# Patient Record
Sex: Female | Born: 1950 | Race: White | Hispanic: No | Marital: Married | Smoking: Never smoker
Health system: Southern US, Community
[De-identification: ages and names within clinical notes are randomized; demographics above are authoritative.]

## PROBLEM LIST (undated history)

## (undated) DIAGNOSIS — E559 Vitamin D deficiency, unspecified: Secondary | ICD-10-CM

## (undated) DIAGNOSIS — K635 Polyp of colon: Secondary | ICD-10-CM

## (undated) DIAGNOSIS — R609 Edema, unspecified: Secondary | ICD-10-CM

## (undated) DIAGNOSIS — E785 Hyperlipidemia, unspecified: Secondary | ICD-10-CM

## (undated) DIAGNOSIS — N3946 Mixed incontinence: Secondary | ICD-10-CM

## (undated) DIAGNOSIS — N952 Postmenopausal atrophic vaginitis: Secondary | ICD-10-CM

## (undated) DIAGNOSIS — K279 Peptic ulcer, site unspecified, unspecified as acute or chronic, without hemorrhage or perforation: Secondary | ICD-10-CM

## (undated) DIAGNOSIS — K219 Gastro-esophageal reflux disease without esophagitis: Secondary | ICD-10-CM

## (undated) DIAGNOSIS — I10 Essential (primary) hypertension: Secondary | ICD-10-CM

## (undated) HISTORY — DX: Postmenopausal atrophic vaginitis: N95.2

## (undated) HISTORY — DX: Peptic ulcer, site unspecified, unspecified as acute or chronic, without hemorrhage or perforation: K27.9

## (undated) HISTORY — DX: Polyp of colon: K63.5

## (undated) HISTORY — DX: Hyperlipidemia, unspecified: E78.5

## (undated) HISTORY — DX: Essential (primary) hypertension: I10

## (undated) HISTORY — DX: Gastro-esophageal reflux disease without esophagitis: K21.9

## (undated) HISTORY — DX: Mixed incontinence: N39.46

## (undated) HISTORY — DX: Vitamin D deficiency, unspecified: E55.9

## (undated) HISTORY — DX: Edema, unspecified: R60.9

---

## 1999-08-29 ENCOUNTER — Other Ambulatory Visit: Admission: RE | Admit: 1999-08-29 | Discharge: 1999-08-29 | Payer: Self-pay | Admitting: Obstetrics and Gynecology

## 1999-08-29 ENCOUNTER — Encounter (INDEPENDENT_AMBULATORY_CARE_PROVIDER_SITE_OTHER): Payer: Self-pay

## 1999-09-04 ENCOUNTER — Encounter: Payer: Self-pay | Admitting: Family Medicine

## 1999-09-04 ENCOUNTER — Encounter: Admission: RE | Admit: 1999-09-04 | Discharge: 1999-09-04 | Payer: Self-pay | Admitting: Family Medicine

## 1999-09-12 ENCOUNTER — Encounter: Payer: Self-pay | Admitting: Family Medicine

## 1999-09-12 ENCOUNTER — Encounter: Admission: RE | Admit: 1999-09-12 | Discharge: 1999-09-12 | Payer: Self-pay | Admitting: Family Medicine

## 1999-10-16 ENCOUNTER — Ambulatory Visit (HOSPITAL_COMMUNITY): Admission: RE | Admit: 1999-10-16 | Discharge: 1999-10-16 | Payer: Self-pay | Admitting: Obstetrics and Gynecology

## 1999-10-16 ENCOUNTER — Encounter (INDEPENDENT_AMBULATORY_CARE_PROVIDER_SITE_OTHER): Payer: Self-pay | Admitting: Specialist

## 2001-01-14 ENCOUNTER — Encounter: Payer: Self-pay | Admitting: Family Medicine

## 2001-01-14 ENCOUNTER — Encounter: Admission: RE | Admit: 2001-01-14 | Discharge: 2001-01-14 | Payer: Self-pay | Admitting: Family Medicine

## 2001-01-15 ENCOUNTER — Other Ambulatory Visit: Admission: RE | Admit: 2001-01-15 | Discharge: 2001-01-15 | Payer: Self-pay | Admitting: Obstetrics and Gynecology

## 2002-02-25 ENCOUNTER — Other Ambulatory Visit: Admission: RE | Admit: 2002-02-25 | Discharge: 2002-02-25 | Payer: Self-pay | Admitting: Obstetrics and Gynecology

## 2002-09-15 ENCOUNTER — Encounter (INDEPENDENT_AMBULATORY_CARE_PROVIDER_SITE_OTHER): Payer: Self-pay | Admitting: Specialist

## 2002-09-15 ENCOUNTER — Ambulatory Visit (HOSPITAL_COMMUNITY): Admission: RE | Admit: 2002-09-15 | Discharge: 2002-09-15 | Payer: Self-pay | Admitting: Gastroenterology

## 2003-03-01 ENCOUNTER — Encounter: Admission: RE | Admit: 2003-03-01 | Discharge: 2003-03-01 | Payer: Self-pay | Admitting: Obstetrics and Gynecology

## 2003-03-03 ENCOUNTER — Other Ambulatory Visit: Admission: RE | Admit: 2003-03-03 | Discharge: 2003-03-03 | Payer: Self-pay | Admitting: Obstetrics and Gynecology

## 2006-11-21 ENCOUNTER — Ambulatory Visit (HOSPITAL_COMMUNITY): Admission: RE | Admit: 2006-11-21 | Discharge: 2006-11-21 | Payer: Self-pay | Admitting: Emergency Medicine

## 2006-11-21 ENCOUNTER — Ambulatory Visit: Payer: Self-pay | Admitting: Surgery

## 2006-12-11 ENCOUNTER — Encounter: Admission: RE | Admit: 2006-12-11 | Discharge: 2006-12-11 | Payer: Self-pay | Admitting: Gastroenterology

## 2006-12-19 ENCOUNTER — Encounter: Admission: RE | Admit: 2006-12-19 | Discharge: 2006-12-19 | Payer: Self-pay | Admitting: Gastroenterology

## 2007-02-20 ENCOUNTER — Other Ambulatory Visit: Admission: RE | Admit: 2007-02-20 | Discharge: 2007-02-20 | Payer: Self-pay | Admitting: Family Medicine

## 2007-11-18 ENCOUNTER — Encounter: Admission: RE | Admit: 2007-11-18 | Discharge: 2007-11-18 | Payer: Self-pay | Admitting: Emergency Medicine

## 2008-01-28 IMAGING — CT CT ABDOMEN W/ CM
2 of 5 series · 16 of 46 positions shown, 18 images · IV contrast ([ID] OMNI 300)
Comparison: None.

ABDOMEN CT WITH CONTRAST:

Addendum Begins
There is a typographical error in the abdomen section of this report. In the
fourth sentence before the impression, the sentence reads "Abdominal bowel loops
abnormal imaging features." This sentence should read "Abdominal bowel loops
have normal imaging features."
Addendum Ends
CLINICAL DATA: Abdominal pain. Nausea. Question of extrinsic compression seen on
endoscopy recently.
TECHNIQUE: Multidetector CT imaging of the abdomen and pelvis was performed
following the standard protocol during bolus administration of intravenous
contrast.

Contrast:  125 cc Omnipaque 300

[Series 3: routine abdomen · axial · 0.90mm/px · z∈[-421,+4]mm · 13 of 95 slices shown, 15 images]
[im 5/95  soft-tissue]
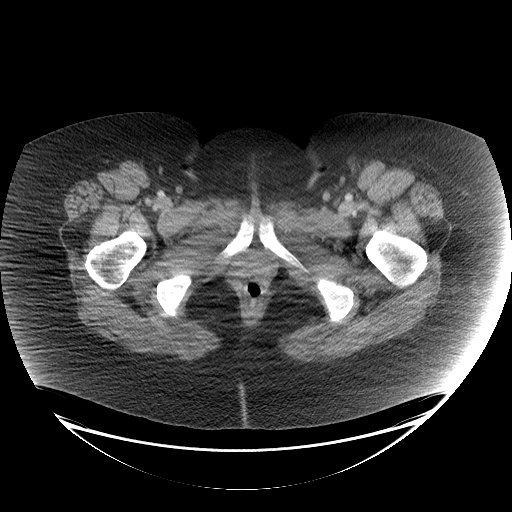
[im 5/95  bone]
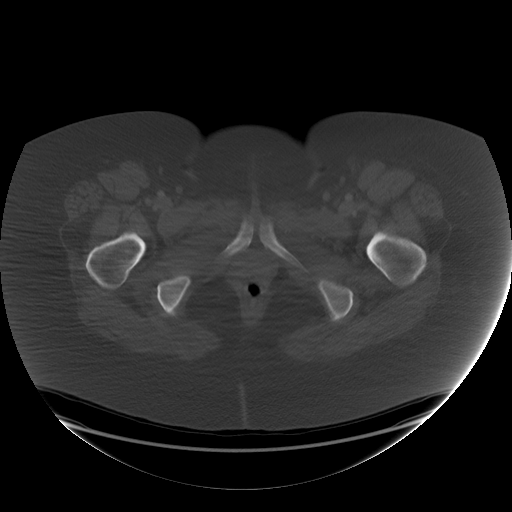
[im 15/95  soft-tissue]
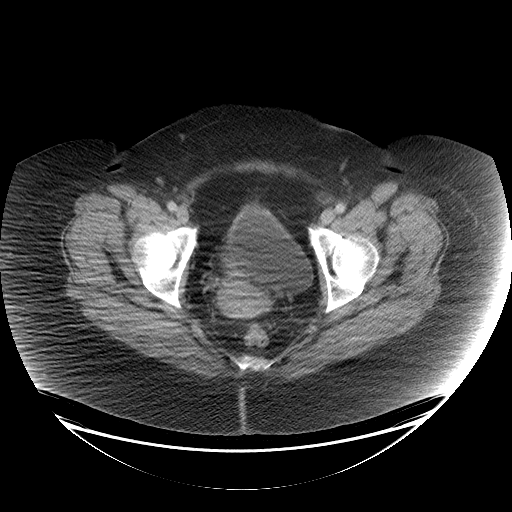
[im 19/95  soft-tissue]
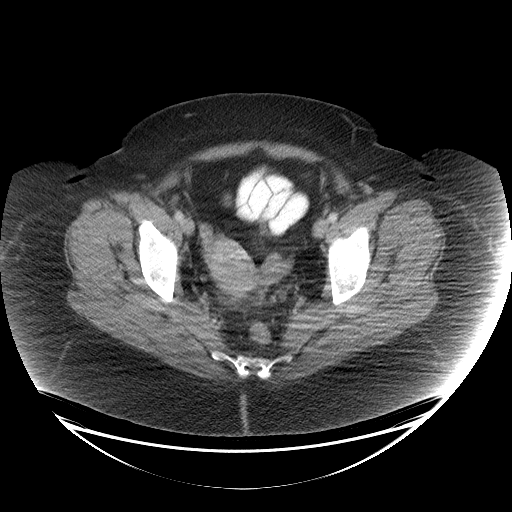
[im 29/95  soft-tissue]
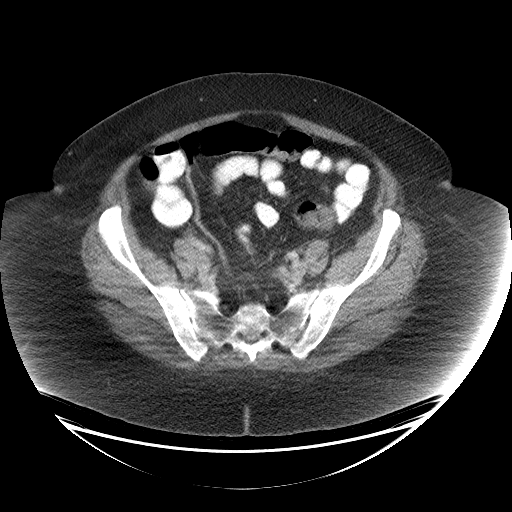
[im 33/95  soft-tissue]
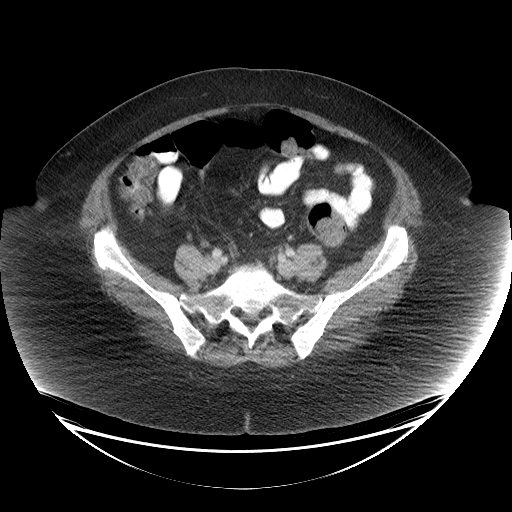
[im 43/95  soft-tissue]
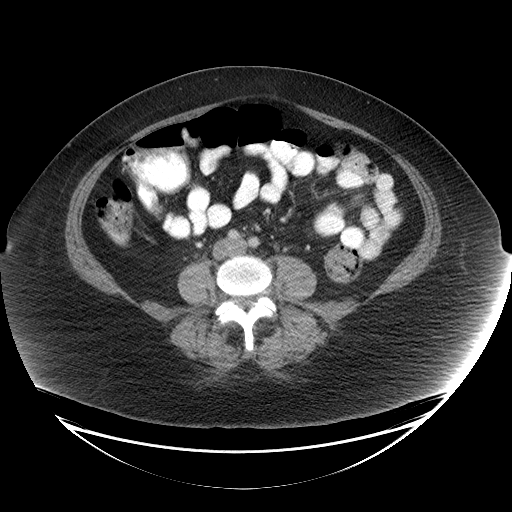
[im 48/95  soft-tissue]
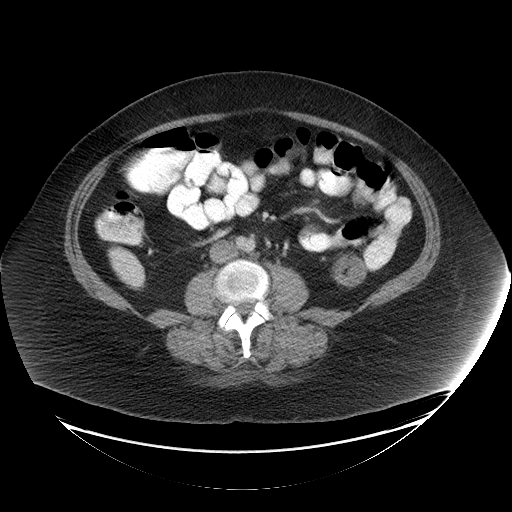
[im 52/95  soft-tissue]
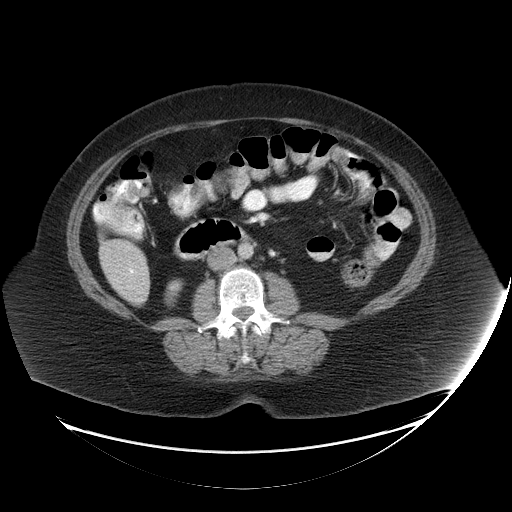
[im 62/95  soft-tissue]
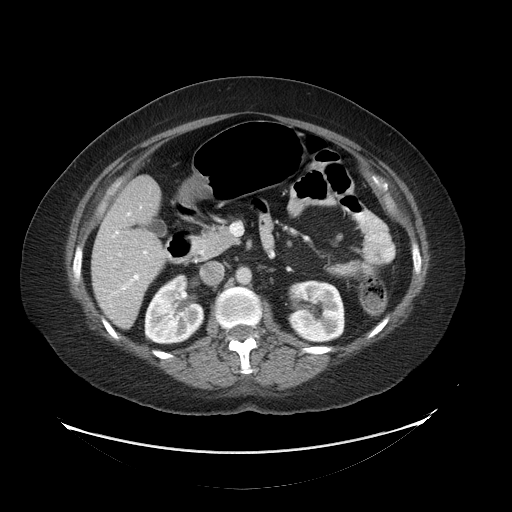
[im 62/95  bone]
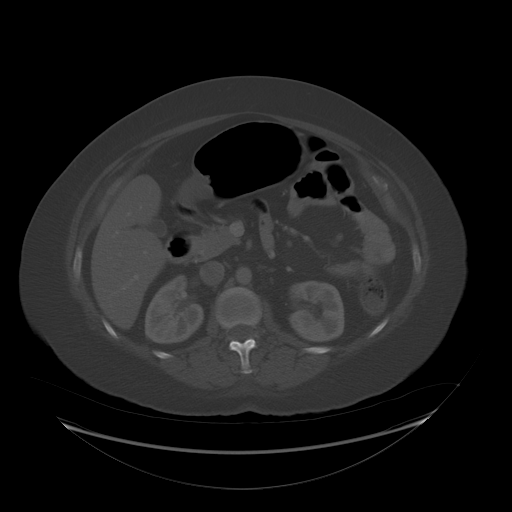
[im 66/95  soft-tissue]
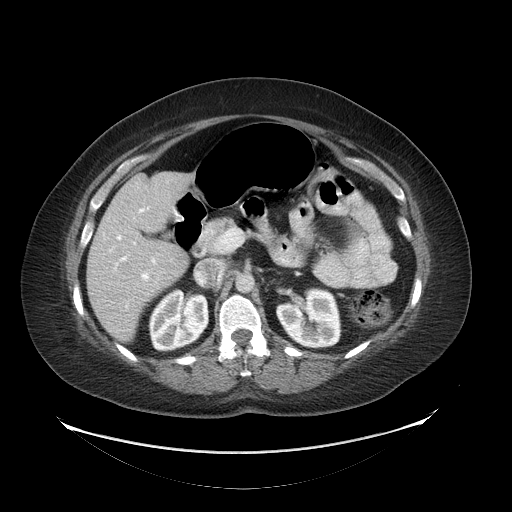
[im 76/95  soft-tissue]
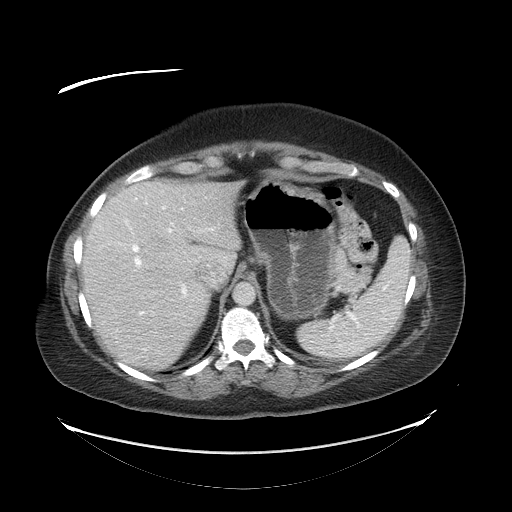
[im 80/95  soft-tissue]
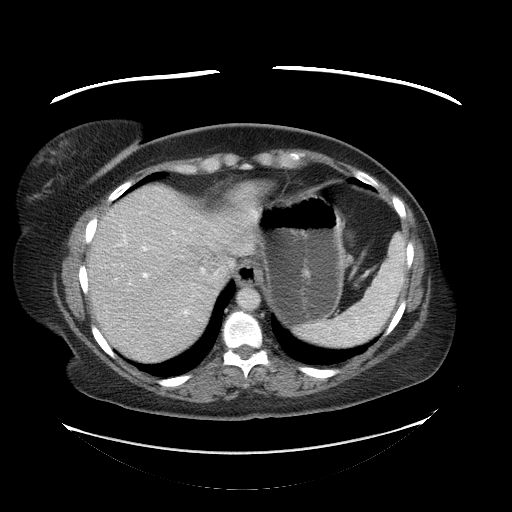
[im 90/95  soft-tissue]
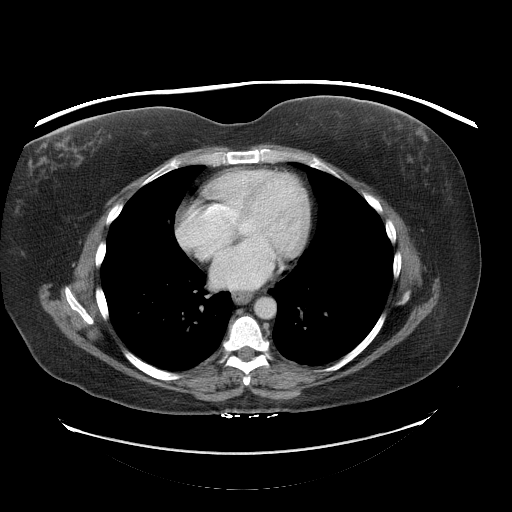

[Series 602: sagittal body · sagittal · 0.93mm/px · 3 of 185 slices shown]
[im 62/185  soft-tissue]
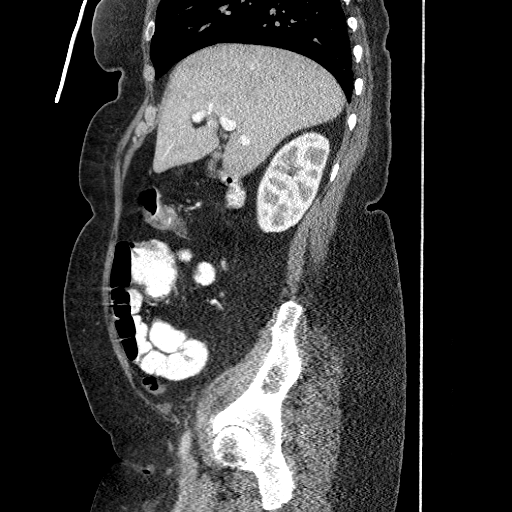
[im 82/185  soft-tissue]
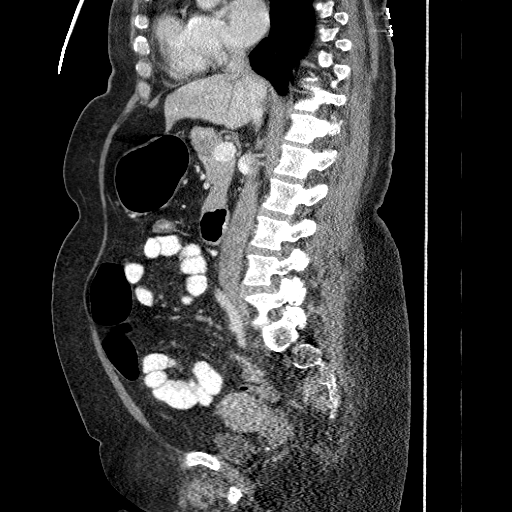
[im 103/185  soft-tissue]
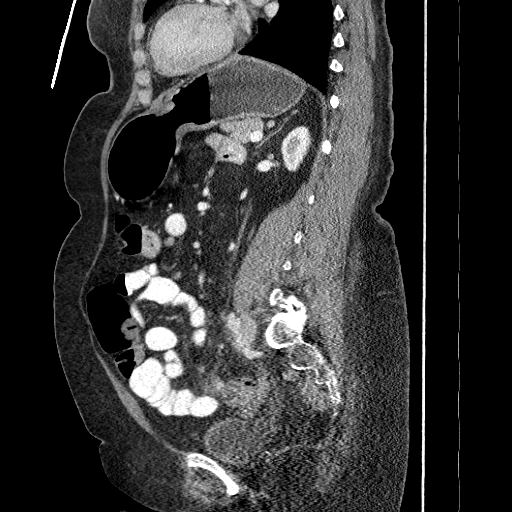

[16 of 46 positions shown; findings below may reference images not displayed]

FINDINGS: A rounded 3.2 x 1.9 cm soft tissue attenuating focus is seen in the
left breast, but has been incompletely visualized.

No focal abnormality is seen in the liver or spleen. Small hiatal hernia is
noted. Duodenum, pancreas, gallbladder, adrenal glands, and kidneys are
unremarkable. Abdominal bowel loops abnormal imaging features. There is no free
fluid. No lymphadenopathy. No abdominal aortic aneurysm.
IMPRESSION: No acute findings in the abdomen.

Incompletely visualized 2 x 3 cm soft tissue attenuating focus in the left
breast. CT's inaccurate for characterizing breast disease in correlation with
recent mammogram and physical exam is recommended.

PELVIS CT WITH CONTRAST:
FINDINGS: There is no pelvic lymphadenopathy. No intraperitoneal free fluid.
The uterus is unremarkable. There is no adnexal mass. Sigmoid colon and is
redundant. No substantial diverticular change in the colon. The terminal ileum
is normal.

The cecal tip is fairly well distended as is the ascending colon. There is a
short segment of the ascending colon, just distal to the ileocecal valve, which
is collapsed. The wall in this region appears thickened.
IMPRESSION: There is an area of nondistention in the proximal ascending colon. The apparent
wall thickening in this region may be related to the collapsed state and/or
peristalsis, but if the patient has had recent colonoscopy, correlation with
those findings is recommended. If there has been no recent colonoscopy, consider
followup colonoscopy to further evaluate.

A question of extrinsic compression on endoscopy was indicated in the history
for this exam although its unclear whether this was upper endoscopy or
colonoscopy. There is no evidence for an intra or extraperitoneal mass lesion
compressing or generating mass-effect on the stomach or colon.

## 2009-08-21 ENCOUNTER — Other Ambulatory Visit: Admission: RE | Admit: 2009-08-21 | Discharge: 2009-08-21 | Payer: Self-pay | Admitting: Family Medicine

## 2010-06-15 NOTE — Op Note (Signed)
[facility]  Patient:    Megan Waters, Megan Waters                    MRN: [id number] Proc. Date: 10/16/99 Adm. Date:  [id number] Attending:  Jenean Lindau CC:         Duncan Dull, M.D.                           Operative Report  PREOPERATIVE DIAGNOSIS:  Dysfunctional uterine bleeding, rule out endometrial polyps.  POSTOPERATIVE DIAGNOSIS:  Multiple endometrial polyps.  PROCEDURE:  Hysteroscopic resection with dilatation and curettage.  SURGEON:  Laqueta Linden, M.D.  ANESTHESIA:  MAC sedation with paracervical block.  ESTIMATED BLOOD LOSS:  Less than 20 cc.  SORBITOL NET INTAKE:  0.  COMPLICATIONS:  None.  INDICATIONS:  [name] is a 60 year old perimenopausal female with dysfunctional uterine bleeding.  She had endometrial sampling which revealed disorder proliferative endometrium with evidence of a polyp.  It was felt likely she had residual polyp or polyps.  Sonohysterogram was deferred, and we decided to proceed directly to hysteroscopic evaluation with resection as indicated.  The patient has been counseled as to the risks, benefits, alternatives, and complications, and agrees to proceed.  DESCRIPTION OF PROCEDURE:  The patient was taken to the operating room and after proper identification and consents were ascertained, she was placed on the operating table in the supine position.  After IV sedation was accomplished, she was placed in the [place] stirrups, and the perineum and vagina were prepped and draped in a routine sterile fashion.  A transurethral Foley was placed which was removed at the conclusion of the procedure. Bimanual examination confirmed an anterior parous mobile uterus.  A large Graves speculum was placed in the vagina and the cervix grasped with a single-tooth tenaculum.  Of note, the patient has a fairly significant degree of perineal relaxation with cystocele and rectocele, but the cervix was quite far up the  vagina and had a minimal amount of descent noted.  A paracervical block utilizing 10 cc of 1% plain Xylocaine was then placed circumferentially. The endometrial cavity sounded to 10 cm.  The internal os was initially patent to the #23 viewing scope which was inserted and showed multiple endometrial polyps.  The scope was then removed, and the endocervix was gently up to a #33 Pratt dilator.  At this point, the resectoscope was then inserted under direct vision with continuous sorbitol infusion.  Resection of the polyps was then accomplished under direct vision with several small bleeding points cauterized.  The entire endometrial cavity appeared somewhat thickened and irregular.  Circumferential resection of endometrial surfaces was accomplished.  Several small bleeding points were cauterized.  The resectioned specimen was sent separately.  The hysteroscope was then removed.  Sharp curettage productive of additional proliferative appearing tissue was then performed and sent as a separate specimen.  The tenaculum was then removed. There was no active bleeding of the tenaculum site or from the cervix.  Net sorbitol intake was 0, estimated blood loss was less than 20 cc with no complications.  The patient was awakened and stable on transfer to the recovery room.  She will be observed and discharged per anesthesia.  She will follow up in the office in four to six weeks time and will likely undergo progesterone cycling versus progesterone only pills. DD:  10/16/99 TD:  10/17/99 Job: [id number]

## 2010-06-15 NOTE — Op Note (Signed)
   NAME:  MICHAILA, KENNEY                       ACCOUNT NO.:  [id number]   MEDICAL RECORD NO.:  [id number]                   PATIENT TYPE:  AMB   LOCATION:  ENDO                                 FACILITY:  [facility]   PHYSICIAN:  Anselmo Rod, M.D.               DATE OF BIRTH:  [birth year]   DATE OF PROCEDURE:  09/15/2002  DATE OF DISCHARGE:                                 OPERATIVE REPORT   PROCEDURE PERFORMED:  Esophagogastroduodenoscopy.   ENDOSCOPIST:  Anselmo Rod, M.D.   INSTRUMENT USED:  Olympus video panendoscope.   INDICATION FOR PROCEDURE:  A 60 year old white female with a history of an  enlarged lymph node on the right side of the neck and a foreign body  sensation, question dysphagia.  Rule out esophageal stricture, esophagitis,  etc.   PREPROCEDURE PREPARATION:  Informed consent was procured from the patient.  The patient was fasted for eight hours prior to the procedure.   PREPROCEDURE PHYSICAL:  VITAL SIGNS:  The patient had stable vital signs.  NECK:  Supple.  CHEST:  Clear to auscultation.  S1, S2 regular.  ABDOMEN:  Soft with normal bowel sounds.   DESCRIPTION OF PROCEDURE:  The patient was placed in the left lateral  decubitus position and sedated with 50 mg of Demerol and 5 mg of Versed  intravenously.  Once the patient was adequately sedate and maintained on low-  flow oxygen and continuous cardiac monitoring, the Olympus video  panendoscope was advanced through the mouthpiece, over the tongue, into the  esophagus under direct vision.  The esophagus was widely patent.  There may  be some evidence of dysmotility, but no strictures, erosions, esophagitis,  masses, etc., were seen.  The scope was then advanced in the stomach.  A  small hiatal hernia was seen on high retroflexion.  The rest of the gastric  mucosa and the proximal small bowel appeared normal.  No extrinsic  compression was noted in the proximal esophagus.   IMPRESSION:  1. Question  esophageal dysmotility.  No stricture or mass is seen.  2. Small hiatal hernia.  3. Otherwise normal esophagogastroduodenoscopy.    RECOMMENDATIONS:  Proceed with a colonoscopy at this time.  Further  recommendations to be made thereafter.                                               Anselmo Rod, M.D.    JNM/MEDQ  D:  09/15/2002  T:  09/16/2002  Job:  [id number]   cc:   Laqueta Linden, M.D.  [address]  [place]  [place] [address]  Fax: [phone]   Duncan Dull, M.D.  [address]  [place]  [place] [address]  Fax: [phone]

## 2010-06-15 NOTE — Op Note (Signed)
NAME:  EVGENIA, MERRIMAN                       ACCOUNT NO.:  [id number]   MEDICAL RECORD NO.:  [id number]                   PATIENT TYPE:  AMB   LOCATION:  ENDO                                 FACILITY:  [facility]   PHYSICIAN:  Anselmo Rod, M.D.               DATE OF BIRTH:  [birth year]   DATE OF PROCEDURE:  09/15/2002  DATE OF DISCHARGE:                                 OPERATIVE REPORT   PROCEDURE PERFORMED:  Colonoscopy with snare polypectomy x 1.   ENDOSCOPIST:  Anselmo Rod, M.D.   INSTRUMENT USED:  Olympus video colonoscope.   INDICATIONS FOR PROCEDURE:  Screening colonoscopy being performed in a 60-  year-old white female, rule out colonic polyps, masses, etc.   PREPROCEDURE PREPARATION:  Informed consent was procured from the patient.  The patient fasted for eight hours prior to the procedure and prepped with a  bottle of magnesium citrate and a gallon of GoLYTELY the night prior to the  procedure.   PREPROCEDURE PHYSICAL:  Patient had stable vital signs.  Neck supple.  Chest  clear to auscultation.  S1 and S2 regular.  Abdomen soft with normal bowel  sounds.   DESCRIPTION OF THE PROCEDURE:  The patient was placed in the left lateral  decubitus position, sedated with an additional 20 mg of Demerol and 2 mg of  Versed intravenously.  She received 50 mg of Demerol and 5 mg of Versed for  the EGD.  Once the patient was adequately sedated and maintained on low flow  oxygen and continuous cardiac monitoring, the Olympus video colonoscope was  advanced from the rectum to the cecum and terminal ileum without difficulty.  A small sessile polyp was snared from 10 cm.  The rest of the colonic mucosa  up to the cecum and TI appeared normal.  Small internal hemorrhoids were  seen on retroflexion.  There was no evidence of diverticulosis, no other  masses or polyps were appreciated.   The patient tolerated the procedure well without complications.   IMPRESSION:  1. Small  nonbleeding internal hemorrhoids.  2. Small sessile polyp snared at 10 cm.  3. Otherwise normal-appearing colon and terminal ileum.              RECOMMENDATIONS:  1. Await pathology results.  2. Repeat colonoscopy depending on pathology results or at an earlier date     if the patient has any abdominal     symptoms in the interim.  3. High fiber diet with liberal fluid intake.  4. Outpatient followup as need arises in the future.                                               Anselmo Rod, M.D.    JNM/MEDQ  D:  09/15/2002  T:  09/16/2002  Job:  [id number]   cc:   Laqueta Linden, M.D.  [address]  [place]  [place] [address]  Fax: [phone]   Duncan Dull, M.D.  [address]  [place]  [place] [address]  Fax: [phone]

## 2020-01-06 ENCOUNTER — Other Ambulatory Visit: Payer: Self-pay | Admitting: Family Medicine

## 2020-01-06 ENCOUNTER — Other Ambulatory Visit (HOSPITAL_COMMUNITY)
Admission: RE | Admit: 2020-01-06 | Discharge: 2020-01-06 | Disposition: A | Discharge status: Home / Self Care | Payer: BC Managed Care – PPO | Source: Ambulatory Visit | Attending: Family Medicine | Admitting: Family Medicine

## 2020-01-06 DIAGNOSIS — Z124 Encounter for screening for malignant neoplasm of cervix: Secondary | ICD-10-CM | POA: Diagnosis not present

## 2020-01-06 DIAGNOSIS — Z01411 Encounter for gynecological examination (general) (routine) with abnormal findings: Secondary | ICD-10-CM | POA: Insufficient documentation

## 2020-01-06 DIAGNOSIS — I471 Supraventricular tachycardia: Secondary | ICD-10-CM | POA: Diagnosis not present

## 2020-01-06 DIAGNOSIS — Z0000 Encounter for general adult medical examination without abnormal findings: Secondary | ICD-10-CM | POA: Diagnosis not present

## 2020-01-06 DIAGNOSIS — Z23 Encounter for immunization: Secondary | ICD-10-CM | POA: Diagnosis not present

## 2020-01-06 DIAGNOSIS — I10 Essential (primary) hypertension: Secondary | ICD-10-CM | POA: Diagnosis not present

## 2020-01-06 DIAGNOSIS — I4891 Unspecified atrial fibrillation: Secondary | ICD-10-CM | POA: Diagnosis not present

## 2020-01-06 NOTE — Progress Notes (Signed)
Cardiology Office Note:    Date:  01/07/2020   ID:  Megan Waters, DOB [birth year], MRN [id number]  PCP:  Mila Palmer, MD  Cardiologist:  No primary care provider on file.  Electrophysiologist:  None   Referring MD: Mila Palmer, MD   Chief Complaint  Patient presents with  . Atrial Fibrillation    History of Present Illness:    Megan Waters is a 69 y.o. female with a hx of recently diagnosed atrial fibrillation, hypertension, hyperlipidemia, peptic ulcer disease who is referred by Dr. Paulino Rily for evaluation of atrial fibrillation.  She reports that about 2 months ago but began feeling very fatigued.  Also reported feeling lightheaded.  Rare palpitations.  Denies any chest pain, dyspnea, or syncope.  Recently did yard work, no worsening in symptoms.  Seen by her PCP yesterday and found to be in atrial fibrillation with RVR, which was not new diagnosis.  Last echocardiogram 12/22/2006, showed normal EF, mild left atrial enlargement.  History of peptic ulcer disease on EGD in 2008.  No recent bleeding.  Labs on 12/02/2019 showed A1c 5.8, albumin 4.0, TSH 3.05, normal LFTs, LDL 148, creatinine 0.74, hemoglobin 15.8, platelets 195.    Past Medical History:  Diagnosis Date  . Atrophic vaginitis   . Chronic edema    no DVT  . GERD (gastroesophageal reflux disease)   . Hyperlipidemia   . Hypertension    echo - 12/22/2006- Normal EF 60% mild left atrial enlargement ,difficult windows   . PUD (peptic ulcer disease)    antral ulcer on EGD 2008- Dr Loreta Ave  . Sessile colonic polyp    personal hx  . Unspecified essential hypertension    nuclear stress-12/22/06 - low risk   . Urge and stress incontinence   . Vitamin D deficiency       Current Medications: Current Meds  Medication Sig  . tolterodine (DETROL LA) 4 MG 24 hr capsule Take 4 mg by mouth 2 (two) times daily.     Allergies:   Neosporin [neomycin-bacitracin zn-polymyx]   Social History   Socioeconomic  History  . Marital status: Married    Spouse name: Not on file  . Number of children: Not on file  . Years of education: Not on file  . Highest education level: Not on file  Occupational History  . Not on file  Tobacco Use  . Smoking status: Never Smoker  . Smokeless tobacco: Never Used  Substance and Sexual Activity  . Alcohol use: Not Currently  . Drug use: Never  . Sexual activity: Yes    Partners: Male  Other Topics Concern  . Not on file  Social History Narrative   History of smoking: Never smoked   No smoking   No Alcohol   Caffeine : yes soda ,diet coke -occas.   No recreational drug use.   No exercise   Occupation : Adult nurse   Married , Yes  daughter recent grad [employer]   Children 1 daughter age 54 and 1 son age 16   Sexually active : currently sexually active   No HRT   GYN History..   Last pap smear date 2009   Last mammogram date 10/.21/2009   Abnormal pap smear none.                  Social Determinants of Health   Financial Resource Strain: Not on file  Food Insecurity: Not on file  Transportation Needs: Not on file  Physical Activity: Not on file  Stress: Not on file  Social Connections: Not on file     Family History: The patient's family history is not on file.  ROS:   Please see the history of present illness.     All other systems reviewed and are negative.  EKGs/Labs/Other Studies Reviewed:    The following studies were reviewed today:   EKG:  EKG is ordered today.  The ekg ordered today demonstrates A. fib with RVR, rate 140  Recent Labs: No results found for requested labs within last 8760 hours.  Recent Lipid Panel No results found for: CHOL, TRIG, HDL, CHOLHDL, VLDL, LDLCALC, LDLDIRECT  Physical Exam:    VS:  BP 138/72   Pulse (!) 140   Ht 5\' 3"  (1.6 m)   Wt 270 lb (122.5 kg)   SpO2 96%   BMI 47.83 kg/m     Wt Readings from Last 3 Encounters:  01/07/20 270 lb (122.5 kg)     GEN:  in no acute  distress HEENT: Normal NECK: No JVD; No carotid bruits CARDIAC: Tachycardic, regular, no murmurs RESPIRATORY:  Clear to auscultation without rales, wheezing or rhonchi  ABDOMEN: Soft, non-tender, non-distended MUSCULOSKELETAL:  No edema; No deformity  SKIN: Warm and dry NEUROLOGIC:  Alert and oriented x 3 PSYCHIATRIC:  Normal affect   ASSESSMENT:    1. Atrial fibrillation, unspecified type (HCC)   2. Snoring   3. Essential hypertension    PLAN:    Atrial fibrillation: New diagnosis, rates 140s in clinic today.  CHA2DS2-VASc score 3 (hypertension, age, female), she was started on Xarelto yesterday, will continue.  As she appears to be tolerating A. Fib, no indication for urgent cardioversion but will need to get rates better controlled.  Will start Toprol-XL 50 mg daily.  Plan cardioversion after she has completed 3 weeks of anticoagulation.  Will follow up in clinic in 2 weeks, and if still in A. fib, will arrange cardioversion.  Will evaluate for OSA with sleep study  Hypertension: BP mildly elevated in clinic today, starting Toprol-XL as above  Snoring: Check sleep study as above  RTC in 2 weeks   Medication Adjustments/Labs and Tests Ordered: Current medicines are reviewed at length with the patient today.  Concerns regarding medicines are outlined above.  Orders Placed This Encounter  Procedures  . EKG 12-Lead  . ECHOCARDIOGRAM COMPLETE  . Split night study   Meds ordered this encounter  Medications  . metoprolol succinate (TOPROL-XL) 50 MG 24 hr tablet    Sig: Take 1 tablet (50 mg total) by mouth daily. Take with or immediately following a meal.    Dispense:  90 tablet    Refill:  3  . rivaroxaban (XARELTO) 20 MG TABS tablet    Sig: Take 1 tablet (20 mg total) by mouth daily with supper.    Dispense:  90 tablet    Refill:  3    Patient Instructions  Medication Instructions:  START metoprolol succinate (Toprol XL) 50 mg daily  *If you need a refill on your  cardiac medications before your next appointment, please call your pharmacy*  Testing/Procedures: Your physician has requested that you have an echocardiogram AFTER cardioversion (cardioversion on 1/4). Echocardiography is a painless test that uses sound waves to create images of your heart. It provides your doctor with information about the size and shape of your heart and how well your heart's chambers and valves are working. This procedure takes approximately one hour. There are  no restrictions for this procedure.  Your physician has recommended that you have a sleep study. This test records several body functions during sleep, including: brain activity, eye movement, oxygen and carbon dioxide blood levels, heart rate and rhythm, breathing rate and rhythm, the flow of air through your mouth and nose, snoring, body muscle movements, and chest and belly movement. --this must be approved by insurance prior to scheduling  Your physician has recommended that you have a Cardioversion (DCCV) (scheduled 1/4). Electrical Cardioversion uses a jolt of electricity to your heart either through paddles or wired patches attached to your chest. This is a controlled, usually prescheduled, procedure. Defibrillation is done under light anesthesia in the hospital, and you usually go home the day of the procedure. This is done to get your heart back into a normal rhythm. You are not awake for the procedure. Please see the instruction sheet given to you today.  Follow-Up: At [facility], you and your health needs are our priority.  As part of our continuing mission to provide you with exceptional heart care, we have created designated Provider Care Teams.  These Care Teams include your primary Cardiologist (physician) and Advanced Practice Providers (APPs -  Physician Assistants and Nurse Practitioners) who all work together to provide you with the care you need, when you need it.  We recommend signing up for the  patient portal called "MyChart".  Sign up information is provided on this After Visit Summary.  MyChart is used to connect with patients for Virtual Visits (Telemedicine).  Patients are able to view lab/test results, encounter notes, upcoming appointments, etc.  Non-urgent messages can be sent to your provider as well.   To learn more about what you can do with MyChart, go to [email or web address].    Your next appointment:   12/30 at 1:40 PM with Dr. Bjorn Pippin      Signed, Little Ishikawa, MD  01/07/2020 9:35 PM    [facility]

## 2020-01-07 ENCOUNTER — Encounter: Payer: Self-pay | Admitting: Cardiology

## 2020-01-07 ENCOUNTER — Ambulatory Visit (INDEPENDENT_AMBULATORY_CARE_PROVIDER_SITE_OTHER): Payer: BC Managed Care – PPO | Admitting: Cardiology

## 2020-01-07 ENCOUNTER — Other Ambulatory Visit: Payer: Self-pay

## 2020-01-07 VITALS — BP 138/72 | HR 140 | Ht 63.0 in | Wt 270.0 lb

## 2020-01-07 DIAGNOSIS — I10 Essential (primary) hypertension: Secondary | ICD-10-CM

## 2020-01-07 DIAGNOSIS — I4891 Unspecified atrial fibrillation: Secondary | ICD-10-CM

## 2020-01-07 DIAGNOSIS — R0683 Snoring: Secondary | ICD-10-CM

## 2020-01-07 MED ORDER — RIVAROXABAN 20 MG PO TABS: 20.0000 mg | ORAL_TABLET | Freq: Every day | ORAL | 3 refills | Status: DC

## 2020-01-07 MED ORDER — METOPROLOL SUCCINATE ER 50 MG PO TB24: 50.0000 mg | ORAL_TABLET | Freq: Every day | ORAL | 3 refills | Status: DC

## 2020-01-07 NOTE — Patient Instructions (Signed)
Medication Instructions:  START metoprolol succinate (Toprol XL) 50 mg daily  *If you need a refill on your cardiac medications before your next appointment, please call your pharmacy*  Testing/Procedures: Your physician has requested that you have an echocardiogram AFTER cardioversion (cardioversion on 1/4). Echocardiography is a painless test that uses sound waves to create images of your heart. It provides your doctor with information about the size and shape of your heart and how well your heart's chambers and valves are working. This procedure takes approximately one hour. There are no restrictions for this procedure.  Your physician has recommended that you have a sleep study. This test records several body functions during sleep, including: brain activity, eye movement, oxygen and carbon dioxide blood levels, heart rate and rhythm, breathing rate and rhythm, the flow of air through your mouth and nose, snoring, body muscle movements, and chest and belly movement. --this must be approved by insurance prior to scheduling  Your physician has recommended that you have a Cardioversion (DCCV) (scheduled 1/4). Electrical Cardioversion uses a jolt of electricity to your heart either through paddles or wired patches attached to your chest. This is a controlled, usually prescheduled, procedure. Defibrillation is done under light anesthesia in the hospital, and you usually go home the day of the procedure. This is done to get your heart back into a normal rhythm. You are not awake for the procedure. Please see the instruction sheet given to you today.  Follow-Up: At [facility], you and your health needs are our priority.  As part of our continuing mission to provide you with exceptional heart care, we have created designated Provider Care Teams.  These Care Teams include your primary Cardiologist (physician) and Advanced Practice Providers (APPs -  Physician Assistants and Nurse Practitioners) who all  work together to provide you with the care you need, when you need it.  We recommend signing up for the patient portal called "MyChart".  Sign up information is provided on this After Visit Summary.  MyChart is used to connect with patients for Virtual Visits (Telemedicine).  Patients are able to view lab/test results, encounter notes, upcoming appointments, etc.  Non-urgent messages can be sent to your provider as well.   To learn more about what you can do with MyChart, go to [email or web address].    Your next appointment:   12/30 at 1:40 PM with Dr. Bjorn Pippin

## 2020-01-10 LAB — CYTOLOGY - PAP
Diagnosis: NEGATIVE
Diagnosis: REACTIVE

## 2020-01-13 ENCOUNTER — Ambulatory Visit: Payer: BC Managed Care – PPO | Admitting: Internal Medicine

## 2020-01-17 ENCOUNTER — Telehealth: Payer: Self-pay | Admitting: Cardiology

## 2020-01-17 NOTE — Telephone Encounter (Signed)
APPROVED; ORDER ID: [id number] THROUGH 01/10/20 - 07/10/20

## 2020-01-23 NOTE — H&P (View-Only) (Signed)
Cardiology Office Note:    Date:  01/27/2020   ID:  Megan Waters, DOB 07/06/1950, MRN [id number]  PCP:  Mila Palmer, MD  Cardiologist:  No primary care provider on file.  Electrophysiologist:  None   Referring MD: Mila Palmer, MD   Chief Complaint  Patient presents with  . Follow-up    FOLLOW-UP  . Atrial Fibrillation    History of Present Illness:    Megan Waters is a 69 y.o. female with a hx of recently diagnosed atrial fibrillation, hypertension, hyperlipidemia, peptic ulcer disease who presents for follow-up.  She was referred by Dr. Paulino Rily for evaluation of atrial fibrillation, initially seen on 01/07/2020.  She reports that about 2 months ago but began feeling very fatigued.  Also reported feeling lightheaded.  Rare palpitations.  Denies any chest pain, dyspnea, or syncope.  Recently did yard work, no worsening in symptoms.  Seen by her PCP on 01/06/2020 and found to be in atrial fibrillation with RVR, which was a new diagnosis.  Last echocardiogram 12/22/2006, showed normal EF, mild left atrial enlargement.  History of peptic ulcer disease on EGD in 2008.  No recent bleeding.  Labs on 12/02/2019 showed A1c 5.8, albumin 4.0, TSH 3.05, normal LFTs, LDL 148, creatinine 0.74, hemoglobin 15.8, platelets 195.  Since last clinic visit, she reports that she has been doing okay.  She purchased [employer] and has been monitoring heart rhythm, and has remained in AF with rates 110s to 120s at home.  Reports her dizziness and fatigue has improved but still has some.  Denies any syncope.  Denies any chest pain, dyspnea, or lower extremity edema.   BP Readings from Last 3 Encounters:  01/07/20 138/72   Wt Readings from Last 3 Encounters:  01/27/20 271 lb 6.4 oz (123.1 kg)  01/07/20 270 lb (122.5 kg)    Past Medical History:  Diagnosis Date  . Atrophic vaginitis   . Chronic edema    no DVT  . GERD (gastroesophageal reflux disease)   . Hyperlipidemia   .  Hypertension    echo - 12/22/2006- Normal EF 60% mild left atrial enlargement ,difficult windows   . PUD (peptic ulcer disease)    antral ulcer on EGD 2008- Dr Loreta Ave  . Sessile colonic polyp    personal hx  . Unspecified essential hypertension    nuclear stress-12/22/06 - low risk   . Urge and stress incontinence   . Vitamin D deficiency       Current Medications: Current Meds  Medication Sig  . Cholecalciferol (VITAMIN D-3) 125 MCG (5000 UT) TABS Take 5,000 Units by mouth daily at 12 noon.  . docusate sodium (COLACE) 100 MG capsule Take 100 mg by mouth daily.  . rivaroxaban (XARELTO) 20 MG TABS tablet Take 1 tablet (20 mg total) by mouth daily with supper.  . tolterodine (DETROL LA) 4 MG 24 hr capsule Take 4 mg by mouth daily.  . vitamin E 180 MG (400 UNITS) capsule Take 400 Units by mouth daily.  . [DISCONTINUED] metoprolol succinate (TOPROL-XL) 50 MG 24 hr tablet Take 1 tablet (50 mg total) by mouth daily. Take with or immediately following a meal. (Patient taking differently: Take 50 mg by mouth at bedtime. Take with or immediately following a meal.)     Allergies:   Neosporin [neomycin-bacitracin zn-polymyx]   Social History   Socioeconomic History  . Marital status: Married    Spouse name: Not on file  . Number of children: Not on  file  . Years of education: Not on file  . Highest education level: Not on file  Occupational History  . Not on file  Tobacco Use  . Smoking status: Never Smoker  . Smokeless tobacco: Never Used  Substance and Sexual Activity  . Alcohol use: Not Currently  . Drug use: Never  . Sexual activity: Yes    Partners: Male  Other Topics Concern  . Not on file  Social History Narrative   History of smoking: Never smoked   No smoking   No Alcohol   Caffeine : yes soda ,diet coke -occas.   No recreational drug use.   No exercise   Occupation : Adult nurse   Married , Yes  daughter recent grad [employer]   Children 1 daughter age 69  and 1 son age 44   Sexually active : currently sexually active   No HRT   GYN History..   Last pap smear date 2009   Last mammogram date 10/.21/2009   Abnormal pap smear none.                  Social Determinants of Health   Financial Resource Strain: Not on file  Food Insecurity: Not on file  Transportation Needs: Not on file  Physical Activity: Not on file  Stress: Not on file  Social Connections: Not on file     Family History: The patient's family history is not on file.  ROS:   Please see the history of present illness.     All other systems reviewed and are negative.  EKGs/Labs/Other Studies Reviewed:    The following studies were reviewed today:   EKG:  EKG is ordered today.  The ekg ordered today demonstrates A. fib with RVR, rate 125  Recent Labs: No results found for requested labs within last 8760 hours.  Recent Lipid Panel No results found for: CHOL, TRIG, HDL, CHOLHDL, VLDL, LDLCALC, LDLDIRECT  Physical Exam:    VS:  Pulse (!) 125   Ht 5\' 3"  (1.6 m)   Wt 271 lb 6.4 oz (123.1 kg)   SpO2 98%   BMI 48.08 kg/m   BP 110/68  Wt Readings from Last 3 Encounters:  01/27/20 271 lb 6.4 oz (123.1 kg)  01/07/20 270 lb (122.5 kg)     GEN:  in no acute distress HEENT: Normal NECK: No JVD; No carotid bruits CARDIAC: Tachycardic, irregular, no murmurs RESPIRATORY:  Clear to auscultation without rales, wheezing or rhonchi  ABDOMEN: Soft, non-tender, non-distended MUSCULOSKELETAL:  No edema; No deformity  SKIN: Warm and dry NEUROLOGIC:  Alert and oriented x 3 PSYCHIATRIC:  Normal affect   ASSESSMENT:    1. Atrial fibrillation, unspecified type (HCC)   2. Pre-procedure lab exam   3. Essential hypertension   4. Snoring    PLAN:    Atrial fibrillation: New diagnosis, rates 140s at initial clinic visit 01/07/20.  CHA2DS2-VASc score 3 (hypertension, age, female), she was started on Xarelto 01/06/20.  As she appears to be tolerating A. Fib, no indication  for urgent cardioversion but will need to get rates better controlled.   -Started Toprol-XL 50 mg daily at initial clinic visit, rates improved but remains elevated, will increase to 50 mg twice daily -Plan cardioversion after she has completed 3 weeks of anticoagulation, scheduled for 02/01/2019   Hypertension: On Toprol-XL 50 mg daily  Snoring: Check sleep study  RTC in 1 month   Medication Adjustments/Labs and Tests Ordered: Current medicines are  reviewed at length with the patient today.  Concerns regarding medicines are outlined above.  Orders Placed This Encounter  Procedures  . Basic metabolic panel  . CBC  . EKG 12-Lead   Meds ordered this encounter  Medications  . metoprolol succinate (TOPROL-XL) 50 MG 24 hr tablet    Sig: Take 1 tablet (50 mg total) by mouth in the morning and at bedtime. Take with or immediately following a meal.    Dispense:  180 tablet    Refill:  3    Dose increase    Patient Instructions  You are scheduled for a Cardioversion on 02/01/20 with Dr. Bjorn Pippin.  Please arrive at the [facility] (Main Entrance A) at [facility]: [address] [place], [place] [address] at 12 pm NOON.  DIET: Nothing to eat or drink after midnight except a sip of water with medications (see medication instructions below)  Medication Instructions: Continue your anticoagulant: Xarelto  You will need to continue your anticoagulant after your  procedure until you  are told by your  Provider that it is safe to stop   Labs: Today in office (BMET, CBC)  You will need a COVID-19  test prior to your procedure. You are scheduled for 01/31/20 at 11:15 AM. This is a Drive Up Visit at [address] [place], [place] [address]. Someone will direct you to the appropriate testing line. Stay in your car and someone will be with you shortly.  You must have a responsible person to drive you home and stay in the waiting area during your procedure. Failure to do so could result in  cancellation.  Bring your insurance cards.  *Special Note: Every effort is made to have your procedure done on time. Occasionally there are emergencies that occur at the hospital that may cause delays. Please be patient if a delay does occur.    Medication Changes: INCREASE metoprolol succinate (Toprol XL) to 50 mg two times daily   Follow up: 1 month with Dr. Bjorn Pippin     Signed, Little Ishikawa, MD  01/27/2020 5:48 PM    [facility]

## 2020-01-23 NOTE — Progress Notes (Signed)
Cardiology Office Note:    Date:  01/27/2020   ID:  [name] Study, DOB 07/06/1950, MRN [id number]  PCP:  Mila Palmer, MD  Cardiologist:  No primary care provider on file.  Electrophysiologist:  None   Referring MD: Mila Palmer, MD   Chief Complaint  Patient presents with  . Follow-up    FOLLOW-UP  . Atrial Fibrillation    History of Present Illness:    Megan Waters is a 69 y.o. female with a hx of recently diagnosed atrial fibrillation, hypertension, hyperlipidemia, peptic ulcer disease who presents for follow-up.  She was referred by Dr. Paulino Rily for evaluation of atrial fibrillation, initially seen on 01/07/2020.  She reports that about 2 months ago but began feeling very fatigued.  Also reported feeling lightheaded.  Rare palpitations.  Denies any chest pain, dyspnea, or syncope.  Recently did yard work, no worsening in symptoms.  Seen by her PCP on 01/06/2020 and found to be in atrial fibrillation with RVR, which was a new diagnosis.  Last echocardiogram 12/22/2006, showed normal EF, mild left atrial enlargement.  History of peptic ulcer disease on EGD in 2008.  No recent bleeding.  Labs on 12/02/2019 showed A1c 5.8, albumin 4.0, TSH 3.05, normal LFTs, LDL 148, creatinine 0.74, hemoglobin 15.8, platelets 195.  Since last clinic visit, she reports that she has been doing okay.  She purchased [employer] and has been monitoring heart rhythm, and has remained in AF with rates 110s to 120s at home.  Reports her dizziness and fatigue has improved but still has some.  Denies any syncope.  Denies any chest pain, dyspnea, or lower extremity edema.   BP Readings from Last 3 Encounters:  01/07/20 138/72   Wt Readings from Last 3 Encounters:  01/27/20 271 lb 6.4 oz (123.1 kg)  01/07/20 270 lb (122.5 kg)    Past Medical History:  Diagnosis Date  . Atrophic vaginitis   . Chronic edema    no DVT  . GERD (gastroesophageal reflux disease)   . Hyperlipidemia   .  Hypertension    echo - 12/22/2006- Normal EF 60% mild left atrial enlargement ,difficult windows   . PUD (peptic ulcer disease)    antral ulcer on EGD 2008- Dr Loreta Ave  . Sessile colonic polyp    personal hx  . Unspecified essential hypertension    nuclear stress-12/22/06 - low risk   . Urge and stress incontinence   . Vitamin D deficiency       Current Medications: Current Meds  Medication Sig  . Cholecalciferol (VITAMIN D-3) 125 MCG (5000 UT) TABS Take 5,000 Units by mouth daily at 12 noon.  . docusate sodium (COLACE) 100 MG capsule Take 100 mg by mouth daily.  . rivaroxaban (XARELTO) 20 MG TABS tablet Take 1 tablet (20 mg total) by mouth daily with supper.  . tolterodine (DETROL LA) 4 MG 24 hr capsule Take 4 mg by mouth daily.  . vitamin E 180 MG (400 UNITS) capsule Take 400 Units by mouth daily.  . [DISCONTINUED] metoprolol succinate (TOPROL-XL) 50 MG 24 hr tablet Take 1 tablet (50 mg total) by mouth daily. Take with or immediately following a meal. (Patient taking differently: Take 50 mg by mouth at bedtime. Take with or immediately following a meal.)     Allergies:   Neosporin [neomycin-bacitracin zn-polymyx]   Social History   Socioeconomic History  . Marital status: Married    Spouse name: Not on file  . Number of children: Not on  file  . Years of education: Not on file  . Highest education level: Not on file  Occupational History  . Not on file  Tobacco Use  . Smoking status: Never Smoker  . Smokeless tobacco: Never Used  Substance and Sexual Activity  . Alcohol use: Not Currently  . Drug use: Never  . Sexual activity: Yes    Partners: Male  Other Topics Concern  . Not on file  Social History Narrative   History of smoking: Never smoked   No smoking   No Alcohol   Caffeine : yes soda ,diet coke -occas.   No recreational drug use.   No exercise   Occupation : Adult nurse   Married , Yes  daughter recent grad [employer]   Children 1 daughter age 69  and 1 son age 44   Sexually active : currently sexually active   No HRT   GYN History..   Last pap smear date 2009   Last mammogram date 10/.21/2009   Abnormal pap smear none.                  Social Determinants of Health   Financial Resource Strain: Not on file  Food Insecurity: Not on file  Transportation Needs: Not on file  Physical Activity: Not on file  Stress: Not on file  Social Connections: Not on file     Family History: The patient's family history is not on file.  ROS:   Please see the history of present illness.     All other systems reviewed and are negative.  EKGs/Labs/Other Studies Reviewed:    The following studies were reviewed today:   EKG:  EKG is ordered today.  The ekg ordered today demonstrates A. fib with RVR, rate 125  Recent Labs: No results found for requested labs within last 8760 hours.  Recent Lipid Panel No results found for: CHOL, TRIG, HDL, CHOLHDL, VLDL, LDLCALC, LDLDIRECT  Physical Exam:    VS:  Pulse (!) 125   Ht 5\' 3"  (1.6 m)   Wt 271 lb 6.4 oz (123.1 kg)   SpO2 98%   BMI 48.08 kg/m   BP 110/68  Wt Readings from Last 3 Encounters:  01/27/20 271 lb 6.4 oz (123.1 kg)  01/07/20 270 lb (122.5 kg)     GEN:  in no acute distress HEENT: Normal NECK: No JVD; No carotid bruits CARDIAC: Tachycardic, irregular, no murmurs RESPIRATORY:  Clear to auscultation without rales, wheezing or rhonchi  ABDOMEN: Soft, non-tender, non-distended MUSCULOSKELETAL:  No edema; No deformity  SKIN: Warm and dry NEUROLOGIC:  Alert and oriented x 3 PSYCHIATRIC:  Normal affect   ASSESSMENT:    1. Atrial fibrillation, unspecified type (HCC)   2. Pre-procedure lab exam   3. Essential hypertension   4. Snoring    PLAN:    Atrial fibrillation: New diagnosis, rates 140s at initial clinic visit 01/07/20.  CHA2DS2-VASc score 3 (hypertension, age, female), she was started on Xarelto 01/06/20.  As she appears to be tolerating A. Fib, no indication  for urgent cardioversion but will need to get rates better controlled.   -Started Toprol-XL 50 mg daily at initial clinic visit, rates improved but remains elevated, will increase to 50 mg twice daily -Plan cardioversion after she has completed 3 weeks of anticoagulation, scheduled for 02/01/2019   Hypertension: On Toprol-XL 50 mg daily  Snoring: Check sleep study  RTC in 1 month   Medication Adjustments/Labs and Tests Ordered: Current medicines are  reviewed at length with the patient today.  Concerns regarding medicines are outlined above.  Orders Placed This Encounter  Procedures  . Basic metabolic panel  . CBC  . EKG 12-Lead   Meds ordered this encounter  Medications  . metoprolol succinate (TOPROL-XL) 50 MG 24 hr tablet    Sig: Take 1 tablet (50 mg total) by mouth in the morning and at bedtime. Take with or immediately following a meal.    Dispense:  180 tablet    Refill:  3    Dose increase    Patient Instructions  You are scheduled for a Cardioversion on 02/01/20 with Dr. Bjorn Pippin.  Please arrive at the [facility] (Main Entrance A) at [facility]: [address] [place], [place] [address] at 12 pm NOON.  DIET: Nothing to eat or drink after midnight except a sip of water with medications (see medication instructions below)  Medication Instructions: Continue your anticoagulant: Xarelto  You will need to continue your anticoagulant after your  procedure until you  are told by your  Provider that it is safe to stop   Labs: Today in office (BMET, CBC)  You will need a COVID-19  test prior to your procedure. You are scheduled for 01/31/20 at 11:15 AM. This is a Drive Up Visit at [address] [place], [place] [address]. Someone will direct you to the appropriate testing line. Stay in your car and someone will be with you shortly.  You must have a responsible person to drive you home and stay in the waiting area during your procedure. Failure to do so could result in  cancellation.  Bring your insurance cards.  *Special Note: Every effort is made to have your procedure done on time. Occasionally there are emergencies that occur at the hospital that may cause delays. Please be patient if a delay does occur.    Medication Changes: INCREASE metoprolol succinate (Toprol XL) to 50 mg two times daily   Follow up: 1 month with Dr. Bjorn Pippin     Signed, Little Ishikawa, MD  01/27/2020 5:48 PM    [facility]

## 2020-01-27 ENCOUNTER — Other Ambulatory Visit: Payer: Self-pay

## 2020-01-27 ENCOUNTER — Encounter: Payer: Self-pay | Admitting: Cardiology

## 2020-01-27 ENCOUNTER — Ambulatory Visit (INDEPENDENT_AMBULATORY_CARE_PROVIDER_SITE_OTHER): Payer: BC Managed Care – PPO | Admitting: Cardiology

## 2020-01-27 VITALS — HR 125 | Ht 63.0 in | Wt 271.4 lb

## 2020-01-27 DIAGNOSIS — I4891 Unspecified atrial fibrillation: Secondary | ICD-10-CM | POA: Diagnosis not present

## 2020-01-27 DIAGNOSIS — R0683 Snoring: Secondary | ICD-10-CM | POA: Diagnosis not present

## 2020-01-27 DIAGNOSIS — Z01812 Encounter for preprocedural laboratory examination: Secondary | ICD-10-CM

## 2020-01-27 DIAGNOSIS — I10 Essential (primary) hypertension: Secondary | ICD-10-CM

## 2020-01-27 MED ORDER — METOPROLOL SUCCINATE ER 50 MG PO TB24: 50.0000 mg | ORAL_TABLET | Freq: Two times a day (BID) | ORAL | 3 refills | Status: DC

## 2020-01-27 NOTE — Patient Instructions (Addendum)
You are scheduled for a Cardioversion on 02/01/20 with Dr. Bjorn Pippin.  Please arrive at the [facility] (Main Entrance A) at [facility]: [address] [place], [place] [address] at 12 pm NOON.  DIET: Nothing to eat or drink after midnight except a sip of water with medications (see medication instructions below)  Medication Instructions: Continue your anticoagulant: Xarelto  You will need to continue your anticoagulant after your  procedure until you  are told by your  Provider that it is safe to stop   Labs: Today in office (BMET, CBC)  You will need a COVID-19  test prior to your procedure. You are scheduled for 01/31/20 at 11:15 AM. This is a Drive Up Visit at [address] [place], [place] [address]. Someone will direct you to the appropriate testing line. Stay in your car and someone will be with you shortly.  You must have a responsible person to drive you home and stay in the waiting area during your procedure. Failure to do so could result in cancellation.  Bring your insurance cards.  *Special Note: Every effort is made to have your procedure done on time. Occasionally there are emergencies that occur at the hospital that may cause delays. Please be patient if a delay does occur.    Medication Changes: INCREASE metoprolol succinate (Toprol XL) to 50 mg two times daily   Follow up: 1 month with Dr. Bjorn Pippin

## 2020-01-28 LAB — BASIC METABOLIC PANEL
BUN/Creatinine Ratio: 19 (ref 12–28)
BUN: 18 mg/dL (ref 8–27)
CO2: 22 mmol/L (ref 20–29)
Calcium: 9.4 mg/dL (ref 8.7–10.3)
Chloride: 103 mmol/L (ref 96–106)
Creatinine, Ser: 0.94 mg/dL (ref 0.57–1.00)
GFR calc Af Amer: 72 mL/min/{1.73_m2} (ref >59)
GFR calc non Af Amer: 62 mL/min/{1.73_m2} (ref >59)
Glucose: 150 mg/dL — ABNORMAL HIGH (ref 65–99)
Potassium: 4.5 mmol/L (ref 3.5–5.2)
Sodium: 141 mmol/L (ref 134–144)

## 2020-01-28 LAB — CBC
Hematocrit: 47 % — ABNORMAL HIGH (ref 34.0–46.6)
Hemoglobin: 16.2 g/dL — ABNORMAL HIGH (ref 11.1–15.9)
MCH: 30.2 pg (ref 26.6–33.0)
MCHC: 34.5 g/dL (ref 31.5–35.7)
MCV: 88 fL (ref 79–97)
Platelets: 217 10*3/uL (ref 150–450)
RBC: 5.36 x10E6/uL — ABNORMAL HIGH (ref 3.77–5.28)
RDW: 11.8 % (ref 11.7–15.4)
WBC: 7.7 10*3/uL (ref 3.4–10.8)

## 2020-01-31 ENCOUNTER — Telehealth: Payer: Self-pay | Admitting: Cardiology

## 2020-01-31 ENCOUNTER — Other Ambulatory Visit (HOSPITAL_COMMUNITY): Payer: BC Managed Care – PPO

## 2020-01-31 ENCOUNTER — Other Ambulatory Visit (HOSPITAL_COMMUNITY)
Admission: RE | Admit: 2020-01-31 | Discharge: 2020-01-31 | Disposition: A | Discharge status: Home / Self Care | Payer: BC Managed Care – PPO | Source: Ambulatory Visit | Attending: Cardiology | Admitting: Cardiology

## 2020-01-31 DIAGNOSIS — I4891 Unspecified atrial fibrillation: Secondary | ICD-10-CM | POA: Diagnosis not present

## 2020-01-31 DIAGNOSIS — Z79899 Other long term (current) drug therapy: Secondary | ICD-10-CM | POA: Diagnosis not present

## 2020-01-31 DIAGNOSIS — Z01812 Encounter for preprocedural laboratory examination: Secondary | ICD-10-CM | POA: Insufficient documentation

## 2020-01-31 DIAGNOSIS — Z20822 Contact with and (suspected) exposure to covid-19: Secondary | ICD-10-CM | POA: Insufficient documentation

## 2020-01-31 DIAGNOSIS — I10 Essential (primary) hypertension: Secondary | ICD-10-CM | POA: Diagnosis not present

## 2020-01-31 DIAGNOSIS — Z7901 Long term (current) use of anticoagulants: Secondary | ICD-10-CM | POA: Diagnosis not present

## 2020-01-31 DIAGNOSIS — R0683 Snoring: Secondary | ICD-10-CM | POA: Diagnosis not present

## 2020-01-31 NOTE — Telephone Encounter (Signed)
Megan Waters states she has a procedure tomorrow and would like Dr. Bjorn Pippin to consider doing a transesophageal echocardiogram. She states she is worried about a clot in her heart before the procedure.

## 2020-01-31 NOTE — Telephone Encounter (Signed)
Spoke with pt on the phone about pt wanting to add a TEE on to her cardioversion that is to be done tomorrow.  Pt states that she didn't not bring this concern up to Dr. Bjorn Pippin at her visit on Thursday 01/27/20, but since has been worried that there will be a clot in her heart that will be sent somewhere when she is shocked.  Pt states that she has been in a-fib for 2-3 months and has been on xarelto for 3 weeks and that she has not missed a dose but the pt feels like that if she does/did have a clot that this wouldn't be sufficient amount of time to dissolve or shrink said clot so that cardioversion is safe.  Pt is just very concerned and wants to advocate for herself and make sure that she is not in a situation where they tell her we should have done something different.  Will forward message to Dr. Bjorn Pippin to advise.

## 2020-01-31 NOTE — Telephone Encounter (Signed)
Spoke with patient.  She is requesting TEE/DCCV due to concern for thrombus despite not missing any doses of anticoagulation.  While I do not think a TEE is required, it would not be unreasonable given morbid obesity and there has been raised the question of efficacy of Xarelto in this population.  Will discuss with endoscopy tomorrow about changing procedure to TEE/DCCV

## 2020-02-01 ENCOUNTER — Ambulatory Visit (HOSPITAL_COMMUNITY): Payer: BC Managed Care – PPO | Admitting: Anesthesiology

## 2020-02-01 ENCOUNTER — Encounter (HOSPITAL_COMMUNITY)
Admission: RE | Disposition: A | Discharge status: Home / Self Care | Payer: BC Managed Care – PPO | Source: Ambulatory Visit | Attending: Cardiology

## 2020-02-01 ENCOUNTER — Ambulatory Visit (HOSPITAL_COMMUNITY)
Admission: RE | Admit: 2020-02-01 | Discharge: 2020-02-01 | Disposition: A | Discharge status: Home / Self Care | Payer: BC Managed Care – PPO | Source: Ambulatory Visit | Attending: Cardiology | Admitting: Cardiology

## 2020-02-01 ENCOUNTER — Other Ambulatory Visit: Payer: Self-pay

## 2020-02-01 ENCOUNTER — Encounter (HOSPITAL_COMMUNITY): Payer: Self-pay | Admitting: Cardiology

## 2020-02-01 ENCOUNTER — Ambulatory Visit (HOSPITAL_BASED_OUTPATIENT_CLINIC_OR_DEPARTMENT_OTHER): Payer: BC Managed Care – PPO

## 2020-02-01 DIAGNOSIS — Z79899 Other long term (current) drug therapy: Secondary | ICD-10-CM | POA: Insufficient documentation

## 2020-02-01 DIAGNOSIS — I10 Essential (primary) hypertension: Secondary | ICD-10-CM | POA: Insufficient documentation

## 2020-02-01 DIAGNOSIS — K279 Peptic ulcer, site unspecified, unspecified as acute or chronic, without hemorrhage or perforation: Secondary | ICD-10-CM | POA: Diagnosis not present

## 2020-02-01 DIAGNOSIS — E559 Vitamin D deficiency, unspecified: Secondary | ICD-10-CM | POA: Diagnosis not present

## 2020-02-01 DIAGNOSIS — I4891 Unspecified atrial fibrillation: Secondary | ICD-10-CM | POA: Insufficient documentation

## 2020-02-01 DIAGNOSIS — Z20822 Contact with and (suspected) exposure to covid-19: Secondary | ICD-10-CM | POA: Diagnosis not present

## 2020-02-01 DIAGNOSIS — I4819 Other persistent atrial fibrillation: Secondary | ICD-10-CM

## 2020-02-01 DIAGNOSIS — Z7901 Long term (current) use of anticoagulants: Secondary | ICD-10-CM | POA: Insufficient documentation

## 2020-02-01 DIAGNOSIS — R0683 Snoring: Secondary | ICD-10-CM | POA: Diagnosis not present

## 2020-02-01 HISTORY — PX: TEE WITHOUT CARDIOVERSION: SHX5443

## 2020-02-01 HISTORY — PX: CARDIOVERSION: SHX1299

## 2020-02-01 LAB — SARS CORONAVIRUS 2 (TAT 6-24 HRS): SARS Coronavirus 2: NEGATIVE

## 2020-02-01 SURGERY — CARDIOVERSION
Anesthesia: General

## 2020-02-01 MED ORDER — LIDOCAINE 2% (20 MG/ML) 5 ML SYRINGE
INTRAMUSCULAR | Status: DC | PRN
  Administered 2020-02-01: 80 mg via INTRAVENOUS

## 2020-02-01 MED ORDER — SODIUM CHLORIDE 0.9 % IV SOLN: INTRAVENOUS | Status: DC

## 2020-02-01 MED ORDER — PROPOFOL 500 MG/50ML IV EMUL
INTRAVENOUS | Status: DC | PRN
  Administered 2020-02-01: 100 ug/kg/min via INTRAVENOUS

## 2020-02-01 MED ORDER — DEXMEDETOMIDINE (PRECEDEX) IN NS 20 MCG/5ML (4 MCG/ML) IV SYRINGE
PREFILLED_SYRINGE | INTRAVENOUS | Status: DC | PRN
  Administered 2020-02-01: 8 ug via INTRAVENOUS

## 2020-02-01 MED ORDER — PROPOFOL 10 MG/ML IV BOLUS
INTRAVENOUS | Status: DC | PRN
  Administered 2020-02-01: 10 mg via INTRAVENOUS
  Administered 2020-02-01: 20 mg via INTRAVENOUS
  Administered 2020-02-01: 10 mg via INTRAVENOUS

## 2020-02-01 NOTE — Interval H&P Note (Signed)
History and Physical Interval Note:  02/01/2020 11:45 AM  Megan Waters  has presented today for surgery, with the diagnosis of AFIB.  The various methods of treatment have been discussed with the patient and family. After consideration of risks, benefits and other options for treatment, the patient has consented to  Procedure(s): CARDIOVERSION (N/A) TRANSESOPHAGEAL ECHOCARDIOGRAM (TEE) (N/A) as a surgical intervention.  The patient's history has been reviewed, patient examined, no change in status, stable for surgery.  I have reviewed the patient's chart and labs.  Questions were answered to the patient's satisfaction.     Little Ishikawa

## 2020-02-01 NOTE — Anesthesia Postprocedure Evaluation (Signed)
Anesthesia Post Note  Patient: Megan Waters  Procedure(s) Performed: CARDIOVERSION (N/A ) TRANSESOPHAGEAL ECHOCARDIOGRAM (TEE) (N/A )     Patient location during evaluation: PACU Anesthesia Type: General Level of consciousness: awake and alert, oriented and patient cooperative Pain management: pain level controlled Vital Signs Assessment: post-procedure vital signs reviewed and stable Respiratory status: spontaneous breathing, nonlabored ventilation and respiratory function stable Cardiovascular status: blood pressure returned to baseline and stable Postop Assessment: no apparent nausea or vomiting Anesthetic complications: no   No complications documented.  Last Vitals:  Vitals:   02/01/20 1310 02/01/20 1320  BP: (!) 93/48 (!) 100/55  Pulse: 74 73  Resp: 19 19  Temp:    SpO2: 95% 98%    Last Pain:  Vitals:   02/01/20 1320  TempSrc:   PainSc: 0-No pain                 Lannie Fields

## 2020-02-01 NOTE — Transfer of Care (Signed)
Immediate Anesthesia Transfer of Care Note  Patient: Zena Vitelli Mayon  Procedure(s) Performed: CARDIOVERSION (N/A ) TRANSESOPHAGEAL ECHOCARDIOGRAM (TEE) (N/A )  Patient Location: Endoscopy Unit  Anesthesia Type:General  Level of Consciousness: awake, drowsy, patient cooperative and responds to stimulation  Airway & Oxygen Therapy: Patient Spontanous Breathing and Patient connected to nasal cannula oxygen  Post-op Assessment: Report given to RN, Post -op Vital signs reviewed and stable and Patient moving all extremities X 4  Post vital signs: Reviewed and stable  Last Vitals:  Vitals Value Taken Time  BP    Temp    Pulse 75   Resp 12   SpO2 99     Last Pain:  Vitals:   02/01/20 1145  TempSrc: Temporal  PainSc: 0-No pain         Complications: No complications documented.

## 2020-02-01 NOTE — Anesthesia Procedure Notes (Signed)
Procedure Name: MAC Date/Time: 02/01/2020 12:26 PM Performed by: Rande Brunt, CRNA Pre-anesthesia Checklist: Patient identified, Emergency Drugs available, Suction available, Patient being monitored and Timeout performed Patient Re-evaluated:Patient Re-evaluated prior to induction Oxygen Delivery Method: Nasal cannula Induction Type: IV induction Placement Confirmation: positive ETCO2 Dental Injury: Teeth and Oropharynx as per pre-operative assessment

## 2020-02-01 NOTE — Anesthesia Preprocedure Evaluation (Addendum)
Anesthesia Evaluation  Patient identified by MRN, date of birth, ID band Patient awake    Reviewed: Allergy & Precautions, NPO status , Patient's Chart, lab work & pertinent test results, reviewed documented beta blocker date and time   Airway Mallampati: II  TM Distance: >3 FB Neck ROM: Full    Dental no notable dental hx. (+) Teeth Intact   Pulmonary  Snores, never had sleep study   Pulmonary exam normal breath sounds clear to auscultation       Cardiovascular hypertension, Pt. on home beta blockers Normal cardiovascular exam+ dysrhythmias (xarelto) Atrial Fibrillation  Rhythm:Regular Rate:Normal     Neuro/Psych negative neurological ROS  negative psych ROS   GI/Hepatic Neg liver ROS, PUD, GERD  Medicated and Controlled,  Endo/Other  Morbid obesityBMI 48  Renal/GU negative Renal ROS  negative genitourinary   Musculoskeletal negative musculoskeletal ROS (+)   Abdominal   Peds  Hematology negative hematology ROS (+)   Anesthesia Other Findings   Reproductive/Obstetrics negative OB ROS                            Anesthesia Physical Anesthesia Plan  ASA: III  Anesthesia Plan: General   Post-op Pain Management:    Induction: Intravenous  PONV Risk Score and Plan: TIVA and Treatment may vary due to age or medical condition  Airway Management Planned: Natural Airway and Mask  Additional Equipment: None  Intra-op Plan:   Post-operative Plan:   Informed Consent: I have reviewed the patients History and Physical, chart, labs and discussed the procedure including the risks, benefits and alternatives for the proposed anesthesia with the patient or authorized representative who has indicated his/her understanding and acceptance.       Plan Discussed with: CRNA  Anesthesia Plan Comments:        Anesthesia Quick Evaluation

## 2020-02-01 NOTE — CV Procedure (Signed)
   TRANSESOPHAGEAL ECHOCARDIOGRAM GUIDED DIRECT CURRENT CARDIOVERSION  NAME:  Megan Waters   MRN: [id number] DOB:  January 08, 1951   ADMIT DATE: 02/01/2020  INDICATIONS: Symptomatic atrial fibrillation  PROCEDURE:   Informed consent was obtained prior to the procedure. The risks, benefits and alternatives for the procedure were discussed and the patient comprehended these risks.  Risks include, but are not limited to, cough, sore throat, vomiting, nausea, somnolence, esophageal and stomach trauma or perforation, bleeding, low blood pressure, aspiration, pneumonia, infection, trauma to the teeth and death.    After a procedural time-out, the oropharynx was anesthetized and the patient was sedated by the anesthesia service. The transesophageal probe was inserted in the esophagus and stomach without difficulty and multiple views were obtained. Anesthesia was monitored by Ulla Potash, CRNA.  COMPLICATIONS:    Complications: No complications Patient tolerated procedure well.  FINDINGS:  Spontaneous echo contrast in LAA but no thrombus seen   CARDIOVERSION:     Indications:  Symptomatic Atrial Fibrillation  Procedure Details:  Once the TEE was complete, the patient had the defibrillator pads placed in the anterior and posterior position. Once an appropriate level of sedation was confirmed, the patient was cardioverted x 1 with 200J of biphasic synchronized energy.  The patient converted to NSR with rate 70s.  There were no apparent complications.  The patient had normal neuro status and respiratory status post procedure with vitals stable as recorded elsewhere.  Adequate airway was maintained throughout and vital signs monitored per protocol.  Epifanio Lesches MD [facility]  [address] [place], [place] [address] [phone]   4:23 PM

## 2020-02-01 NOTE — Progress Notes (Signed)
Echocardiogram Echocardiogram Transesophageal has been performed.  Megan Waters 02/01/2020, 1:11 PM

## 2020-02-01 NOTE — Discharge Instructions (Signed)
Electrical Cardioversion Electrical cardioversion is the delivery of a jolt of electricity to restore a normal rhythm to the heart. A rhythm that is too fast or is not regular keeps the heart from pumping well. In this procedure, sticky patches or metal paddles are placed on the chest to deliver electricity to the heart from a device. This procedure may be done in an emergency if:  There is low or no blood pressure as a result of the heart rhythm.  Normal rhythm must be restored as fast as possible to protect the brain and heart from further damage.  It may save a life. This may also be a scheduled procedure for irregular or fast heart rhythms that are not immediately life-threatening. Tell a health care provider about:  Any allergies you have.  All medicines you are taking, including vitamins, herbs, eye drops, creams, and over-the-counter medicines.  Any problems you or family members have had with anesthetic medicines.  Any blood disorders you have.  Any surgeries you have had.  Any medical conditions you have.  Whether you are pregnant or may be pregnant. What are the risks? Generally, this is a safe procedure. However, problems may occur, including:  Allergic reactions to medicines.  A blood clot that breaks free and travels to other parts of your body.  The possible return of an abnormal heart rhythm within hours or days after the procedure.  Your heart stopping (cardiac arrest). This is rare. What happens before the procedure? Medicines  Your health care provider may have you start taking: ? Blood-thinning medicines (anticoagulants) so your blood does not clot as easily. ? Medicines to help stabilize your heart rate and rhythm.  Ask your health care provider about: ? Changing or stopping your regular medicines. This is especially important if you are taking diabetes medicines or blood thinners. ? Taking medicines such as aspirin and ibuprofen. These medicines can  thin your blood. Do not take these medicines unless your health care provider tells you to take them. ? Taking over-the-counter medicines, vitamins, herbs, and supplements. General instructions  Follow instructions from your health care provider about eating or drinking restrictions.  Plan to have someone take you home from the hospital or clinic.  If you will be going home right after the procedure, plan to have someone with you for 24 hours.  Ask your health care provider what steps will be taken to help prevent infection. These may include washing your skin with a germ-killing soap. What happens during the procedure?   An IV will be inserted into one of your veins.  Sticky patches (electrodes) or metal paddles may be placed on your chest.  You will be given a medicine to help you relax (sedative).  An electrical shock will be delivered. The procedure may vary among health care providers and hospitals. What can I expect after the procedure?  Your blood pressure, heart rate, breathing rate, and blood oxygen level will be monitored until you leave the hospital or clinic.  Your heart rhythm will be watched to make sure it does not change.  You may have some redness on the skin where the shocks were given. Follow these instructions at home:  Do not drive for 24 hours if you were given a sedative during your procedure.  Take over-the-counter and prescription medicines only as told by your health care provider.  Ask your health care provider how to check your pulse. Check it often.  Rest for 48 hours after the procedure or   as told by your health care provider.  Avoid or limit your caffeine use as told by your health care provider.  Keep all follow-up visits as told by your health care provider. This is important. Contact a health care provider if:  You feel like your heart is beating too quickly or your pulse is not regular.  You have a serious muscle cramp that does not go  away. Get help right away if:  You have discomfort in your chest.  You are dizzy or you feel faint.  You have trouble breathing or you are short of breath.  Your speech is slurred.  You have trouble moving an arm or leg on one side of your body.  Your fingers or toes turn cold or blue. Summary  Electrical cardioversion is the delivery of a jolt of electricity to restore a normal rhythm to the heart.  This procedure may be done right away in an emergency or may be a scheduled procedure if the condition is not an emergency.  Generally, this is a safe procedure.  After the procedure, check your pulse often as told by your health care provider. This information is not intended to replace advice given to you by your health care provider. Make sure you discuss any questions you have with your health care provider. Document Revised: 08/17/2018 Document Reviewed: 08/17/2018 Elsevier Patient Education  2020 Elsevier Inc. Transesophageal Echocardiogram Transesophageal echocardiogram (TEE) is a test that uses sound waves to take pictures of your heart. TEE is done by passing a flexible tube down the esophagus. The esophagus is the tube that carries food from the throat to the stomach. The pictures give detailed images of your heart. This can help your doctor see if there are problems with your heart. What happens before the procedure? Staying hydrated Follow instructions from your doctor about hydration, which may include:  Up to 3 hours before the procedure - you may continue to drink clear liquids, such as: ? Water. ? Clear fruit juice. ? Black coffee. ? Plain tea.  Eating and drinking Follow instructions from your doctor about eating and drinking, which may include:  8 hours before the procedure - stop eating heavy meals or foods such as meat, fried foods, or fatty foods.  6 hours before the procedure - stop eating light meals or foods, such as toast or cereal.  6 hours before  the procedure - stop drinking milk or drinks that contain milk.  3 hours before the procedure - stop drinking clear liquids. General instructions  You will need to take out any dentures or retainers.  Plan to have someone take you home from the hospital or clinic.  If you will be going home right after the procedure, plan to have someone with you for 24 hours.  Ask your doctor about: ? Changing or stopping your normal medicines. This is important if you take diabetes medicines or blood thinners. ? Taking over-the-counter medicines, vitamins, herbs, and supplements. ? Taking medicines such as aspirin and ibuprofen. These medicines can thin your blood. Do not take these medicines unless your doctor tells you to take them. What happens during the procedure?  To lower your risk of infection, your doctors will wash or clean their hands.  An IV will be put into one of your veins.  You will be given a medicine to help you relax (sedative).  A medicine may be sprayed or gargled. This numbs the back of your throat.  Your blood pressure, heart rate,   and breathing will be watched.  You may be asked to lay on your left side.  A bite block will be placed in your mouth. This keeps you from biting the tube.  The tip of the TEE probe will be placed into the back of your mouth.  You will be asked to swallow.  Your doctor will take pictures of your heart.  The probe and bite block will be taken out. The procedure may vary among doctors and hospitals. What happens after the procedure?   Your blood pressure, heart rate, breathing rate, and blood oxygen level will be watched until the medicines you were given have worn off.  When you first wake up, your throat may feel sore and numb. This will get better over time. You will not be allowed to eat or drink until the numbness has gone away.  Do not drive for 24 hours if you were given a medicine to help you relax. Summary  TEE is a test that  uses sound waves to take pictures of your heart.  You will be given a medicine to help you relax.  Do not drive for 24 hours if you were given a medicine to help you relax. This information is not intended to replace advice given to you by your health care provider. Make sure you discuss any questions you have with your health care provider. Document Revised: 10/03/2017 Document Reviewed: 04/17/2016 Elsevier Patient Education  2020 Elsevier Inc.  

## 2020-02-07 ENCOUNTER — Other Ambulatory Visit: Payer: Self-pay

## 2020-02-07 ENCOUNTER — Ambulatory Visit (HOSPITAL_COMMUNITY): Payer: BC Managed Care – PPO | Attending: Internal Medicine

## 2020-02-07 ENCOUNTER — Telehealth: Payer: Self-pay | Admitting: Cardiology

## 2020-02-07 DIAGNOSIS — I4891 Unspecified atrial fibrillation: Secondary | ICD-10-CM | POA: Diagnosis not present

## 2020-02-07 NOTE — Telephone Encounter (Signed)
Left message to call back  

## 2020-02-07 NOTE — Telephone Encounter (Signed)
Patient c/o Palpitations:  High priority if patient c/o lightheadedness, shortness of breath, or chest pain  1) How long have you had palpitations/irregular HR/ Afib? Are you having the symptoms now? Afib, came back on Friday 02/04/20  2) Are you currently experiencing lightheadedness, SOB or CP? Not now. SOB when walking up 2 flights of stairs.   3) Do you have a history of afib (atrial fibrillation) or irregular heart rhythm? yes  4) Have you checked your BP or HR? (document readings if available): HR 89 BP N/A  5) Are you experiencing any other symptoms? No    Patient had a cardioversion on Tuesday 02/01/20 and was back in afib on Friday 02/04/20 and has been all weekend. She has an echo scheduled for today but she wants to know if she should go to the appt. Please advise.

## 2020-02-07 NOTE — Telephone Encounter (Signed)
Would keep echo appointment.  Can we schedule in Afib clinic for this week?

## 2020-02-07 NOTE — Telephone Encounter (Signed)
Pt calling to let Dr. Bjorn Pippin know that she went back into afib on Friday, January 7 per [facility] device. Pt is status post cardioversion 02/01/20. Peak HR 126 on Friday afternoon. HR has since gone down to around 89. She states she feels fine. States she had a very brief, isolated episode of dizziness Sunday, which has resolved. On medications as ordered. Pt is wondering if she should keep her appointment for her echocardiogram scheduled for 2 p.m. today. Advised pt to plan to keep this appointment unless she is directed otherwise.

## 2020-02-08 LAB — ECHOCARDIOGRAM COMPLETE
Area-P 1/2: 4.78 cm2
S' Lateral: 2.6 cm

## 2020-02-09 NOTE — Telephone Encounter (Signed)
Spoke to patient-scheduled for appt in Afib clinic 1/17 at 2pm.  Directions and code provided.      Patient asymptomatic, has kardia mobile device showing she is back in Afib.    Echo results discussed as well.

## 2020-02-09 NOTE — Telephone Encounter (Signed)
Megan Waters is returning Megan Waters's call to schedule with the Afib clinic. Please advise.

## 2020-02-09 NOTE — Addendum Note (Signed)
Addended by: Johney Frame A on: 02/09/2020 11:02 AM   Modules accepted: Orders

## 2020-02-14 ENCOUNTER — Ambulatory Visit (HOSPITAL_COMMUNITY): Payer: BC Managed Care – PPO | Admitting: Physician Assistant

## 2020-02-17 ENCOUNTER — Other Ambulatory Visit: Payer: Self-pay

## 2020-02-17 ENCOUNTER — Encounter (HOSPITAL_COMMUNITY): Payer: Self-pay | Admitting: Physician Assistant

## 2020-02-17 ENCOUNTER — Ambulatory Visit (HOSPITAL_COMMUNITY)
Admission: RE | Admit: 2020-02-17 | Discharge: 2020-02-17 | Disposition: A | Discharge status: Home / Self Care | Payer: BC Managed Care – PPO | Source: Ambulatory Visit | Attending: Physician Assistant | Admitting: Physician Assistant

## 2020-02-17 VITALS — BP 128/82 | HR 118 | Ht 63.0 in | Wt 273.4 lb

## 2020-02-17 DIAGNOSIS — I10 Essential (primary) hypertension: Secondary | ICD-10-CM | POA: Diagnosis not present

## 2020-02-17 DIAGNOSIS — Z6841 Body Mass Index (BMI) 40.0 and over, adult: Secondary | ICD-10-CM | POA: Insufficient documentation

## 2020-02-17 DIAGNOSIS — E669 Obesity, unspecified: Secondary | ICD-10-CM | POA: Diagnosis not present

## 2020-02-17 DIAGNOSIS — I4819 Other persistent atrial fibrillation: Secondary | ICD-10-CM

## 2020-02-17 DIAGNOSIS — E785 Hyperlipidemia, unspecified: Secondary | ICD-10-CM | POA: Diagnosis not present

## 2020-02-17 DIAGNOSIS — I4892 Unspecified atrial flutter: Secondary | ICD-10-CM | POA: Insufficient documentation

## 2020-02-17 DIAGNOSIS — Z7901 Long term (current) use of anticoagulants: Secondary | ICD-10-CM | POA: Insufficient documentation

## 2020-02-17 DIAGNOSIS — D6869 Other thrombophilia: Secondary | ICD-10-CM | POA: Diagnosis not present

## 2020-02-17 MED ORDER — METOPROLOL SUCCINATE ER 50 MG PO TB24: ORAL_TABLET | ORAL | 3 refills | Status: DC

## 2020-02-17 NOTE — Patient Instructions (Signed)
Increase metoprolol to 75mg twice a day 

## 2020-02-17 NOTE — Progress Notes (Signed)
Primary Care Physician: Mila Palmer, MD Primary Cardiologist: Dr Bjorn Pippin Primary Electrophysiologist: none Referring Physician: Dr Donnamarie Poag Megan Waters is a 70 y.o. female with a history of hypertension, hyperlipidemia, peptic ulcer disease, and atrial fibrillation who presents for consultation in the [facility]. The patient was initially diagnosed with atrial fibrillation 01/07/20 after presenting with symptoms of increased fatigue. Patient is on Xarelto for a CHADS2VASC score of 3. She underwent DCCV on 02/01/20. Unfortunately, she felt she was back out of rhythm 02/04/20. She was under a considerable amount of stress, both at home and at work, which she feels contributed to her afib recurrence. She does feel that her symptoms of fatigue did improved while in SR. She has a sleep study upcoming.   Today, she denies symptoms of palpitations, chest pain, shortness of breath, orthopnea, PND, lower extremity edema, dizziness, presyncope, syncope, bleeding, or neurologic sequela. The patient is tolerating medications without difficulties and is otherwise without complaint today.    Atrial Fibrillation Risk Factors:  she does have symptoms or diagnosis of sleep apnea. Sleep study pending  she does not have a history of rheumatic fever. The patient does not have a history of early familial atrial fibrillation or other arrhythmias.  she has a BMI of Body mass index is 48.43 kg/m.Marland Kitchen Filed Weights   02/17/20 1332  Weight: 124 kg    No family history on file.   Atrial Fibrillation Management history:  Previous antiarrhythmic drugs: none Previous cardioversions: 02/01/20 Previous ablations: none CHADS2VASC score: 3 Anticoagulation history: Xarelto   Past Medical History:  Diagnosis Date  . Atrophic vaginitis   . Chronic edema    no DVT  . GERD (gastroesophageal reflux disease)   . Hyperlipidemia   . Hypertension    echo - 12/22/2006- Normal EF  60% mild left atrial enlargement ,difficult windows   . PUD (peptic ulcer disease)    antral ulcer on EGD 2008- Dr Loreta Ave  . Sessile colonic polyp    personal hx  . Unspecified essential hypertension    nuclear stress-12/22/06 - low risk   . Urge and stress incontinence   . Vitamin D deficiency    Past Surgical History:  Procedure Laterality Date  . CARDIOVERSION N/A 02/01/2020   Procedure: CARDIOVERSION;  Surgeon: Little Ishikawa, MD;  Location: [facility] ENDOSCOPY;  Service: Cardiovascular;  Laterality: N/A;  . TEE WITHOUT CARDIOVERSION N/A 02/01/2020   Procedure: TRANSESOPHAGEAL ECHOCARDIOGRAM (TEE);  Surgeon: Little Ishikawa, MD;  Location: [facility] ENDOSCOPY;  Service: Cardiovascular;  Laterality: N/A;    Current Outpatient Medications  Medication Sig Dispense Refill  . BD PEN NEEDLE NANO 2ND GEN 32G X 4 MM MISC USE WITH SAXENDA EVERY DAY    . Cholecalciferol (VITAMIN D-3) 125 MCG (5000 UT) TABS Take 5,000 Units by mouth daily at 12 noon.    . docusate sodium (COLACE) 100 MG capsule Take 100 mg by mouth daily.    . metoprolol succinate (TOPROL-XL) 50 MG 24 hr tablet Take 1 tablet (50 mg total) by mouth in the morning and at bedtime. Take with or immediately following a meal. 180 tablet 3  . rivaroxaban (XARELTO) 20 MG TABS tablet Take 1 tablet (20 mg total) by mouth daily with supper. 90 tablet 3  . tolterodine (DETROL LA) 4 MG 24 hr capsule Take 4 mg by mouth daily.    . vitamin E 180 MG (400 UNITS) capsule Take 400 Units by mouth daily.  No current facility-administered medications for this encounter.    Allergies  Allergen Reactions  . Neosporin [Neomycin-Bacitracin Zn-Polymyx]     ( triple) WBC inflamation: allergy    Social History   Socioeconomic History  . Marital status: Married    Spouse name: Not on file  . Number of children: Not on file  . Years of education: Not on file  . Highest education level: Not on file  Occupational History  . Not on file   Tobacco Use  . Smoking status: Never Smoker  . Smokeless tobacco: Never Used  Substance and Sexual Activity  . Alcohol use: Yes    Alcohol/week: 1.0 standard drink    Types: 1 Standard drinks or equivalent per week  . Drug use: Never  . Sexual activity: Yes    Partners: Male  Other Topics Concern  . Not on file  Social History Narrative   History of smoking: Never smoked   No smoking   No Alcohol   Caffeine : yes soda ,diet coke -occas.   No recreational drug use.   No exercise   Occupation : Adult nurse   Married , Yes  daughter recent grad [employer]   Children 1 daughter age 39 and 1 son age 55   Sexually active : currently sexually active   No HRT   GYN History..   Last pap smear date 2009   Last mammogram date 10/.21/2009   Abnormal pap smear none.                  Social Determinants of Health   Financial Resource Strain: Not on file  Food Insecurity: Not on file  Transportation Needs: Not on file  Physical Activity: Not on file  Stress: Not on file  Social Connections: Not on file  Intimate Partner Violence: Not on file     ROS- All systems are reviewed and negative except as per the HPI above.  Physical Exam: Vitals:   02/17/20 1332  BP: 128/82  Pulse: (!) 118  Weight: 124 kg  Height: 5\' 3"  (1.6 m)    GEN- The patient is well appearing obese female, alert and oriented x 3 today.   Head- normocephalic, atraumatic Eyes-  Sclera clear, conjunctiva pink Ears- hearing intact Oropharynx- clear Neck- supple  Lungs- Clear to ausculation bilaterally, normal work of breathing Heart- irregular rate and rhythm, no murmurs, rubs or gallops  GI- soft, NT, ND, + BS Extremities- no clubbing, cyanosis, or edema MS- no significant deformity or atrophy Skin- no rash or lesion Psych- euthymic mood, full affect Neuro- strength and sensation are intact  Wt Readings from Last 3 Encounters:  02/17/20 124 kg  01/27/20 123.1 kg  01/07/20 122.5 kg     EKG today demonstrates  Afib  Vent. rate 118 BPM QRS duration 78 ms QT/QTc 332/465 ms   Echo 02/07/20 demonstrated  1. Left ventricular ejection fraction, by estimation, is 55 to 60%. The  left ventricle has normal function. The left ventricle has no regional  wall motion abnormalities. There is mild concentric left ventricular  hypertrophy. Left ventricular diastolic  parameters are indeterminate.  2. Right ventricular systolic function is normal. The right ventricular  size is normal. There is normal pulmonary artery systolic pressure.  3. Planimetry has underestimated the atrial size: manual measurement is  would yield a index volume of 45 cc/BSA. Left atrial size was mild to  moderately dilated.  4. Planimetry has underestimated the atrial size: manual measurement  is  would yield a index volume of 23 cc/BSA.  5. The mitral valve is grossly normal. Trivial mitral valve  regurgitation.  6. The aortic valve is tricuspid. Aortic valve regurgitation is not  visualized. No aortic stenosis is present.  7. The inferior vena cava is normal in size with greater than 50%  respiratory variability, suggesting right atrial pressure of 3 mmHg.   Epic records are reviewed at length today  CHA2DS2-VASc Score = 3  The patient's score is based upon: CHF History: No HTN History: Yes Diabetes History: No Stroke History: No Vascular Disease History: No Age Score: 1 Gender Score: 1      ASSESSMENT AND PLAN: 1. Persistent Atrial Fibrillation (ICD10:  I48.19) The patient's CHA2DS2-VASc score is 3, indicating a 3.2% annual risk of stroke.   S/p DCCV 02/01/20 with early return of afib. We discussed therapeutic options today including AAD (flecainide), repeat DCCV, and rate control. QT 486 in SR, will need to monitor closely. For now, patient would like to take time to consider her options. Increase Toprol to 75 mg BID for rate control.  Continue Xarelto 20 mg daily  2. Secondary  Hypercoagulable State (ICD10:  D68.69) The patient is at significant risk for stroke/thromboembolism based upon her CHA2DS2-VASc Score of 3.  Continue Rivaroxaban (Xarelto).   3. Obesity Body mass index is 48.43 kg/m. Lifestyle modification was discussed at length including regular exercise and weight reduction. Patient planning on starting diet plan.  4. Suspected OSA The importance of adequate treatment of sleep apnea was discussed today in order to improve our ability to maintain sinus rhythm long term. Sleep study pending.  5. HTN Stable, med changes as above.    Follow up in the AF clinic in 2 weeks.    Jorja Loa PA-C Afib Clinic [facility] [address] [place], [place] [address] [phone] 02/17/2020 1:39 PM

## 2020-02-28 NOTE — Progress Notes (Signed)
Cardiology Office Note:    Date:  02/29/2020   ID:  Megan Waters, DOB 10-Aug-1950, MRN [id number]  PCP:  Mila Palmer, MD  Cardiologist:  No primary care provider on file.  Electrophysiologist:  None   Referring MD: Mila Palmer, MD   Chief Complaint  Patient presents with  . Atrial Fibrillation    History of Present Illness:    Megan Waters is a 70 y.o. female with a hx of recently diagnosed atrial fibrillation, hypertension, hyperlipidemia, peptic ulcer disease who presents for follow-up.  She was referred by Dr. Paulino Rily for evaluation of atrial fibrillation, initially seen on 01/07/2020.  She reports that about 2 months ago but began feeling very fatigued.  Also reported feeling lightheaded.  Rare palpitations.  Denies any chest pain, dyspnea, or syncope.  Recently did yard work, no worsening in symptoms.  Seen by her PCP on 01/06/2020 and found to be in atrial fibrillation with RVR, which was a new diagnosis.  Last echocardiogram 12/22/2006, showed normal EF, mild left atrial enlargement.  History of peptic ulcer disease on EGD in 2008.  No recent bleeding.  Labs on 12/02/2019 showed A1c 5.8, albumin 4.0, TSH 3.05, normal LFTs, LDL 148, creatinine 0.74, hemoglobin 15.8, platelets 195.  Underwent successful TEE cardioversion on 02/01/2020.  TEE showed EF 50 to 55%, mildly reduced RV function, no LAA thrombus.  Unfortunately converted back to atrial fibrillation a few days following cardioversion.  TTE 02/07/2020 showed EF 55 to 60%, normal RV function, mild to moderate left atrial dilatation.  Was seen in A. fib clinic on 02/17/2020, flecainide was recommended but she is considering.  Since last clinic visit, she reports that she has been doing okay.  She had successful cardioversion on 1/4, but converted back to A. fib 2 days later.  States that she was under a lot of stress during those 2 days, as she was in the ED the following day with her sister in law who was having  stroke-like symptoms and then was testifying in court as an expert was witnessed the day following that.  She denies any chest pain.  Does report that she has noted some mild shortness of breath when walking up stairs recently.  Rare lightheadedness, denies any syncope.  Reports mild lower extremity edema.  She has been taking Xarelto, denies any bleeding issues.    BP Readings from Last 3 Encounters:  02/29/20 100/64  02/17/20 128/82  02/01/20 (!) 100/55   Wt Readings from Last 3 Encounters:  02/29/20 274 lb (124.3 kg)  02/17/20 273 lb 6.4 oz (124 kg)  01/27/20 271 lb 6.4 oz (123.1 kg)    Past Medical History:  Diagnosis Date  . Atrophic vaginitis   . Chronic edema    no DVT  . GERD (gastroesophageal reflux disease)   . Hyperlipidemia   . Hypertension    echo - 12/22/2006- Normal EF 60% mild left atrial enlargement ,difficult windows   . PUD (peptic ulcer disease)    antral ulcer on EGD 2008- Dr Loreta Ave  . Sessile colonic polyp    personal hx  . Unspecified essential hypertension    nuclear stress-12/22/06 - low risk   . Urge and stress incontinence   . Vitamin D deficiency       Current Medications: Current Meds  Medication Sig  . BD PEN NEEDLE NANO 2ND GEN 32G X 4 MM MISC USE WITH SAXENDA EVERY DAY  . Cholecalciferol (VITAMIN D-3) 125 MCG (5000 UT) TABS Take 5,000  Units by mouth daily at 12 noon.  . docusate sodium (COLACE) 100 MG capsule Take 100 mg by mouth daily.  . metoprolol succinate (TOPROL-XL) 50 MG 24 hr tablet Take 75mg  twice a day  . rivaroxaban (XARELTO) 20 MG TABS tablet Take 1 tablet (20 mg total) by mouth daily with supper.  . tolterodine (DETROL LA) 4 MG 24 hr capsule Take 4 mg by mouth daily.  . vitamin E 180 MG (400 UNITS) capsule Take 400 Units by mouth daily.     Allergies:   Neosporin [neomycin-bacitracin zn-polymyx]   Social History   Socioeconomic History  . Marital status: Married    Spouse name: Not on file  . Number of children: Not on  file  . Years of education: Not on file  . Highest education level: Not on file  Occupational History  . Not on file  Tobacco Use  . Smoking status: Never Smoker  . Smokeless tobacco: Never Used  Substance and Sexual Activity  . Alcohol use: Yes    Alcohol/week: 1.0 standard drink    Types: 1 Standard drinks or equivalent per week  . Drug use: Never  . Sexual activity: Yes    Partners: Male  Other Topics Concern  . Not on file  Social History Narrative   History of smoking: Never smoked   No smoking   No Alcohol   Caffeine : yes soda ,diet coke -occas.   No recreational drug use.   No exercise   Occupation :   Married , Yes  daughter recent grad [employer]   Children 1 daughter age 19 and 1 son age 36   Sexually active : currently sexually active   No HRT   GYN History..   Last pap smear date 2009   Last mammogram date 10/.21/2009   Abnormal pap smear none.                  Social Determinants of Health   Financial Resource Strain: Not on file  Food Insecurity: Not on file  Transportation Needs: Not on file  Physical Activity: Not on file  Stress: Not on file  Social Connections: Not on file     Family History: The patient's family history is not on file.  ROS:   Please see the history of present illness.     All other systems reviewed and are negative.  EKGs/Labs/Other Studies Reviewed:    The following studies were reviewed today:   EKG:  EKG is ordered today.  The ekg ordered today demonstrates A. fib with RVR, rate 123, nonspecific T wave flattening  Recent Labs: 01/27/2020: BUN 18; Creatinine, Ser 0.94; Hemoglobin 16.2; Platelets 217; Potassium 4.5; Sodium 141  Recent Lipid Panel No results found for: CHOL, TRIG, HDL, CHOLHDL, VLDL, LDLCALC, LDLDIRECT  Physical Exam:    VS:  BP 100/64   Pulse (!) 123   Ht 5\' 3"  (1.6 m)   Wt 274 lb (124.3 kg)   SpO2 98%   BMI 48.54 kg/m   BP 110/68  Wt Readings from Last 3  Encounters:  02/29/20 274 lb (124.3 kg)  02/17/20 273 lb 6.4 oz (124 kg)  01/27/20 271 lb 6.4 oz (123.1 kg)     GEN:  in no acute distress HEENT: Normal NECK: No JVD; No carotid bruits CARDIAC: Tachycardic, irregular, no murmurs RESPIRATORY:  Clear to auscultation without rales, wheezing or rhonchi  ABDOMEN: Soft, non-tender, non-distended MUSCULOSKELETAL:  No edema; No deformity  SKIN: Warm and dry NEUROLOGIC:  Alert and oriented x 3 PSYCHIATRIC:  Normal affect   ASSESSMENT:    1. Persistent atrial fibrillation (HCC)   2. Essential hypertension   3. Hyperlipidemia, unspecified hyperlipidemia type   4. Snoring    PLAN:    Atrial fibrillation: New diagnosis, rates 140s at initial clinic visit 01/07/20.  CHA2DS2-VASc score 3 (hypertension, age, female), she was started on Xarelto 01/06/20.  Underwent successful TEE cardioversion on 02/01/2020.  TEE showed EF 50 to 55%, mildly reduced RV function, no LAA thrombus.  Unfortunately converted back to atrial fibrillation a few days following cardioversion.  TTE 02/07/2020 showed EF 55 to 60%, normal RV function, mild to moderate left atrial dilatation.  Was seen in A. fib clinic on 02/17/2020, flecainide was recommended but she is considering. -On Toprol-XL 75 mg twice daily.  Rates elevated to 120s in clinic today, though she reports in 90s when checks at home.  Recommended increasing dose to 100 mg twice daily, but she declines at this time, states that she would like to continue current dose of Toprol for now -Continue Xarelto 20 mg daily -She was seen by A. fib clinic, she is considering starting flecainide.  Discussed with patient, would recommend trial of flecainide and repeat cardioversion if she does not convert.  She states that she will think about it and has follow-up with A. fib clinic in 2 days  Hypertension: On Toprol-XL 75 mg twice daily.  Appears controlled.  Hyperlipidemia: Reports recent labs showed LDL 120.  Will check calcium  score to guide how aggressive to be in lowering cholesterol  Snoring: Check sleep study, planned for later this month   RTC in 2 months   Medication Adjustments/Labs and Tests Ordered: Current medicines are reviewed at length with the patient today.  Concerns regarding medicines are outlined above.  Orders Placed This Encounter  Procedures  . CT CARDIAC SCORING (SELF PAY ONLY)  . EKG 12-Lead   No orders of the defined types were placed in this encounter.   Patient Instructions  Medication Instructions:  Your physician recommends that you continue on your current medications as directed. Please refer to the Current Medication list given to you today.  *If you need a refill on your cardiac medications before your next appointment, please call your pharmacy*  Testing/Procedures: CT coronary calcium score. This test is done at [address]. This is $99 out of pocket.   Coronary CalciumScan A coronary calcium scan is an imaging test used to look for deposits of calcium and other fatty materials (plaques) in the inner lining of the blood vessels of the heart (coronary arteries). These deposits of calcium and plaques can partly clog and narrow the coronary arteries without producing any symptoms or warning signs. This puts a person at risk for a heart attack. This test can detect these deposits before symptoms develop. Tell a health care provider about:  Any allergies you have.  All medicines you are taking, including vitamins, herbs, eye drops, creams, and over-the-counter medicines.  Any problems you or family members have had with anesthetic medicines.  Any blood disorders you have.  Any surgeries you have had.  Any medical conditions you have.  Whether you are pregnant or may be pregnant. What are the risks? Generally, this is a safe procedure. However, problems may occur, including:  Harm to a pregnant woman and her unborn baby. This test involves the  use of radiation. Radiation exposure can be  dangerous to a pregnant woman and her unborn baby. If you are pregnant, you generally should not have this procedure done.  Slight increase in the risk of cancer. This is because of the radiation involved in the test. What happens before the procedure? No preparation is needed for this procedure. What happens during the procedure?  You will undress and remove any jewelry around your neck or chest.  You will put on a hospital gown.  Sticky electrodes will be placed on your chest. The electrodes will be connected to an electrocardiogram (ECG) machine to record a tracing of the electrical activity of your heart.  A CT scanner will take pictures of your heart. During this time, you will be asked to lie still and hold your breath for 2-3 seconds while a picture of your heart is being taken. The procedure may vary among health care providers and hospitals. What happens after the procedure?  You can get dressed.  You can return to your normal activities.  It is up to you to get the results of your test. Ask your health care provider, or the department that is doing the test, when your results will be ready. Summary  A coronary calcium scan is an imaging test used to look for deposits of calcium and other fatty materials (plaques) in the inner lining of the blood vessels of the heart (coronary arteries).  Generally, this is a safe procedure. Tell your health care provider if you are pregnant or may be pregnant.  No preparation is needed for this procedure.  A CT scanner will take pictures of your heart.  You can return to your normal activities after the scan is done. This information is not intended to replace advice given to you by your health care provider. Make sure you discuss any questions you have with your health care provider. Document Released: 07/13/2007 Document Revised: 12/04/2015 Document Reviewed: 12/04/2015 Elsevier Interactive  Patient Education  2017 [employer].  Follow-Up: At [facility] you and your health needs are our priority.  As part of our continuing mission to provide you with exceptional heart care, we have created designated Provider Care Teams.  These Care Teams include your primary Cardiologist (physician) and Advanced Practice Providers (APPs -  Physician Assistants and Nurse Practitioners) who all work together to provide you with the care you need, when you need it.  We recommend signing up for the patient portal called "MyChart".  Sign up information is provided on this After Visit Summary.  MyChart is used to connect with patients for Virtual Visits (Telemedicine).  Patients are able to view lab/test results, encounter notes, upcoming appointments, etc.  Non-urgent messages can be sent to your provider as well.   To learn more about what you can do with MyChart, go to [email or web address].    Your next appointment:   2 month(s)  The format for your next appointment:   In Person  Provider:   Epifanio Lesches, MD       Signed, Little Ishikawa, MD  02/29/2020 5:38 PM    [facility]

## 2020-02-29 ENCOUNTER — Encounter: Payer: Self-pay | Admitting: Cardiology

## 2020-02-29 ENCOUNTER — Other Ambulatory Visit: Payer: Self-pay

## 2020-02-29 ENCOUNTER — Ambulatory Visit (INDEPENDENT_AMBULATORY_CARE_PROVIDER_SITE_OTHER): Payer: BC Managed Care – PPO | Admitting: Cardiology

## 2020-02-29 VITALS — BP 100/64 | HR 123 | Ht 63.0 in | Wt 274.0 lb

## 2020-02-29 DIAGNOSIS — I10 Essential (primary) hypertension: Secondary | ICD-10-CM | POA: Diagnosis not present

## 2020-02-29 DIAGNOSIS — I4819 Other persistent atrial fibrillation: Secondary | ICD-10-CM

## 2020-02-29 DIAGNOSIS — E785 Hyperlipidemia, unspecified: Secondary | ICD-10-CM | POA: Diagnosis not present

## 2020-02-29 DIAGNOSIS — R0683 Snoring: Secondary | ICD-10-CM

## 2020-02-29 NOTE — Patient Instructions (Signed)
Medication Instructions:  Your physician recommends that you continue on your current medications as directed. Please refer to the Current Medication list given to you today.  *If you need a refill on your cardiac medications before your next appointment, please call your pharmacy*  Testing/Procedures: CT coronary calcium score. This test is done at [address]. This is $99 out of pocket.   Coronary CalciumScan A coronary calcium scan is an imaging test used to look for deposits of calcium and other fatty materials (plaques) in the inner lining of the blood vessels of the heart (coronary arteries). These deposits of calcium and plaques can partly clog and narrow the coronary arteries without producing any symptoms or warning signs. This puts a person at risk for a heart attack. This test can detect these deposits before symptoms develop. Tell a health care provider about:  Any allergies you have.  All medicines you are taking, including vitamins, herbs, eye drops, creams, and over-the-counter medicines.  Any problems you or family members have had with anesthetic medicines.  Any blood disorders you have.  Any surgeries you have had.  Any medical conditions you have.  Whether you are pregnant or may be pregnant. What are the risks? Generally, this is a safe procedure. However, problems may occur, including:  Harm to a pregnant woman and her unborn baby. This test involves the use of radiation. Radiation exposure can be dangerous to a pregnant woman and her unborn baby. If you are pregnant, you generally should not have this procedure done.  Slight increase in the risk of cancer. This is because of the radiation involved in the test. What happens before the procedure? No preparation is needed for this procedure. What happens during the procedure?  You will undress and remove any jewelry around your neck or chest.  You will put on a hospital gown.  Sticky  electrodes will be placed on your chest. The electrodes will be connected to an electrocardiogram (ECG) machine to record a tracing of the electrical activity of your heart.  A CT scanner will take pictures of your heart. During this time, you will be asked to lie still and hold your breath for 2-3 seconds while a picture of your heart is being taken. The procedure may vary among health care providers and hospitals. What happens after the procedure?  You can get dressed.  You can return to your normal activities.  It is up to you to get the results of your test. Ask your health care provider, or the department that is doing the test, when your results will be ready. Summary  A coronary calcium scan is an imaging test used to look for deposits of calcium and other fatty materials (plaques) in the inner lining of the blood vessels of the heart (coronary arteries).  Generally, this is a safe procedure. Tell your health care provider if you are pregnant or may be pregnant.  No preparation is needed for this procedure.  A CT scanner will take pictures of your heart.  You can return to your normal activities after the scan is done. This information is not intended to replace advice given to you by your health care provider. Make sure you discuss any questions you have with your health care provider. Document Released: 07/13/2007 Document Revised: 12/04/2015 Document Reviewed: 12/04/2015 Elsevier Interactive Patient Education  2017 [place]: At [facility], you and your health needs are our priority.  As part of our continuing  mission to provide you with exceptional heart care, we have created designated Provider Care Teams.  These Care Teams include your primary Cardiologist (physician) and Advanced Practice Providers (APPs -  Physician Assistants and Nurse Practitioners) who all work together to provide you with the care you need, when you need it.  We recommend signing up  for the patient portal called "MyChart".  Sign up information is provided on this After Visit Summary.  MyChart is used to connect with patients for Virtual Visits (Telemedicine).  Patients are able to view lab/test results, encounter notes, upcoming appointments, etc.  Non-urgent messages can be sent to your provider as well.   To learn more about what you can do with MyChart, go to [email or web address].    Your next appointment:   2 month(s)  The format for your next appointment:   In Person  Provider:   Epifanio Lesches, MD

## 2020-03-02 ENCOUNTER — Other Ambulatory Visit: Payer: Self-pay

## 2020-03-02 ENCOUNTER — Encounter (HOSPITAL_COMMUNITY): Payer: Self-pay | Admitting: Physician Assistant

## 2020-03-02 ENCOUNTER — Ambulatory Visit (HOSPITAL_COMMUNITY)
Admission: RE | Admit: 2020-03-02 | Discharge: 2020-03-02 | Disposition: A | Discharge status: Home / Self Care | Payer: BC Managed Care – PPO | Source: Ambulatory Visit | Attending: Physician Assistant | Admitting: Physician Assistant

## 2020-03-02 VITALS — BP 122/78 | HR 121 | Ht 63.0 in | Wt 275.4 lb

## 2020-03-02 DIAGNOSIS — D6869 Other thrombophilia: Secondary | ICD-10-CM | POA: Diagnosis not present

## 2020-03-02 DIAGNOSIS — I4819 Other persistent atrial fibrillation: Secondary | ICD-10-CM | POA: Insufficient documentation

## 2020-03-02 DIAGNOSIS — Z79899 Other long term (current) drug therapy: Secondary | ICD-10-CM | POA: Insufficient documentation

## 2020-03-02 DIAGNOSIS — I10 Essential (primary) hypertension: Secondary | ICD-10-CM | POA: Diagnosis not present

## 2020-03-02 DIAGNOSIS — E669 Obesity, unspecified: Secondary | ICD-10-CM | POA: Diagnosis not present

## 2020-03-02 DIAGNOSIS — Z20822 Contact with and (suspected) exposure to covid-19: Secondary | ICD-10-CM | POA: Diagnosis not present

## 2020-03-02 DIAGNOSIS — Z6841 Body Mass Index (BMI) 40.0 and over, adult: Secondary | ICD-10-CM | POA: Diagnosis not present

## 2020-03-02 DIAGNOSIS — Z01812 Encounter for preprocedural laboratory examination: Secondary | ICD-10-CM | POA: Insufficient documentation

## 2020-03-02 DIAGNOSIS — Z7901 Long term (current) use of anticoagulants: Secondary | ICD-10-CM | POA: Diagnosis not present

## 2020-03-02 MED ORDER — FLECAINIDE ACETATE 50 MG PO TABS: 50.0000 mg | ORAL_TABLET | Freq: Two times a day (BID) | ORAL | 3 refills | Status: DC

## 2020-03-02 MED ORDER — METOPROLOL SUCCINATE ER 50 MG PO TB24: 100.0000 mg | ORAL_TABLET | Freq: Two times a day (BID) | ORAL | 1 refills | Status: DC

## 2020-03-02 NOTE — Patient Instructions (Signed)
Increase metoprolol to 100mg  twice a day  Start Flecainide 50mg  twice a day     Cardioversion scheduled for Thursday, February 10th  - Arrive at the and go to admitting at 830AM  - Do not eat or drink anything after midnight the night prior to your procedure.  - Take all your morning medication (except diabetic medications) with a sip of water prior to arrival.  - You will not be able to drive home after your procedure.  - Do NOT miss any doses of your blood thinner - if you should miss a dose please notify our office immediately.  - If you feel as if you go back into normal rhythm prior to scheduled cardioversion, please notify our office immediately. If your procedure is canceled in the cardioversion suite you will be charged a cancellation fee.

## 2020-03-02 NOTE — Progress Notes (Signed)
Primary Care Physician: Mila Palmer, MD Primary Cardiologist: Dr Bjorn Pippin Primary Electrophysiologist: none Referring Physician: Dr Donnamarie Poag Megan Waters is a 70 y.o. female with a history of hypertension, hyperlipidemia, peptic ulcer disease, and atrial fibrillation who presents for follow up in the [facility]. The patient was initially diagnosed with atrial fibrillation 01/07/20 after presenting with symptoms of increased fatigue. Patient is on Xarelto for a CHADS2VASC score of 3. She underwent DCCV on 02/01/20. Unfortunately, she felt she was back out of rhythm 02/04/20. She was under a considerable amount of stress, both at home and at work, which she feels contributed to her afib recurrence. She does feel that her symptoms of fatigue did improved while in SR. She has a sleep study upcoming.   On follow up today, patient remains in rapid afib. She reports that her home Megan Waters mobile has shown heart rates in 90s-low 100s. She does have increased fatigue with afib. She denies any bleeding issues on anticoagulation.   Today, she denies symptoms of palpitations, chest pain, shortness of breath, orthopnea, PND, lower extremity edema, dizziness, presyncope, syncope, bleeding, or neurologic sequela. The patient is tolerating medications without difficulties and is otherwise without complaint today.    Atrial Fibrillation Risk Factors:  she does have symptoms or diagnosis of sleep apnea. Sleep study pending  she does not have a history of rheumatic fever. The patient does not have a history of early familial atrial fibrillation or other arrhythmias.  she has a BMI of Body mass index is 48.78 kg/m.Marland Kitchen Filed Weights   03/02/20 1536  Weight: 124.9 kg    No family history on file.   Atrial Fibrillation Management history:  Previous antiarrhythmic drugs: none Previous cardioversions: 02/01/20 Previous ablations: none CHADS2VASC score: 3 Anticoagulation  history: Xarelto   Past Medical History:  Diagnosis Date  . Atrophic vaginitis   . Chronic edema    no DVT  . GERD (gastroesophageal reflux disease)   . Hyperlipidemia   . Hypertension    echo - 12/22/2006- Normal EF 60% mild left atrial enlargement ,difficult windows   . PUD (peptic ulcer disease)    antral ulcer on EGD 2008- Dr Loreta Ave  . Sessile colonic polyp    personal hx  . Unspecified essential hypertension    nuclear stress-12/22/06 - low risk   . Urge and stress incontinence   . Vitamin D deficiency    Past Surgical History:  Procedure Laterality Date  . CARDIOVERSION N/A 02/01/2020   Procedure: CARDIOVERSION;  Surgeon: Little Ishikawa, MD;  Location: [facility] ENDOSCOPY;  Service: Cardiovascular;  Laterality: N/A;  . TEE WITHOUT CARDIOVERSION N/A 02/01/2020   Procedure: TRANSESOPHAGEAL ECHOCARDIOGRAM (TEE);  Surgeon: Little Ishikawa, MD;  Location: [facility] ENDOSCOPY;  Service: Cardiovascular;  Laterality: N/A;    Current Outpatient Medications  Medication Sig Dispense Refill  . BD PEN NEEDLE NANO 2ND GEN 32G X 4 MM MISC USE WITH SAXENDA EVERY DAY    . Cholecalciferol (VITAMIN D-3) 125 MCG (5000 UT) TABS Take 5,000 Units by mouth daily at 12 noon.    . docusate sodium (COLACE) 100 MG capsule Take 100 mg by mouth daily.    . flecainide (TAMBOCOR) 50 MG tablet Take 1 tablet (50 mg total) by mouth 2 (two) times daily. 60 tablet 3  . rivaroxaban (XARELTO) 20 MG TABS tablet Take 1 tablet (20 mg total) by mouth daily with supper. 90 tablet 3  . tolterodine (DETROL LA) 4 MG 24 hr  capsule Take 4 mg by mouth daily.    . vitamin E 180 MG (400 UNITS) capsule Take 400 Units by mouth daily.    . metoprolol succinate (TOPROL-XL) 50 MG 24 hr tablet Take 2 tablets (100 mg total) by mouth in the morning and at bedtime. 120 tablet 1   No current facility-administered medications for this encounter.    Allergies  Allergen Reactions  . Neosporin [Neomycin-Bacitracin Zn-Polymyx]     (  triple) WBC inflamation: allergy    Social History   Socioeconomic History  . Marital status: Married    Spouse name: Not on file  . Number of children: Not on file  . Years of education: Not on file  . Highest education level: Not on file  Occupational History  . Not on file  Tobacco Use  . Smoking status: Never Smoker  . Smokeless tobacco: Never Used  Substance and Sexual Activity  . Alcohol use: Yes    Alcohol/week: 1.0 standard drink    Types: 1 Standard drinks or equivalent per week  . Drug use: Never  . Sexual activity: Yes    Partners: Male  Other Topics Concern  . Not on file  Social History Narrative   History of smoking: Never smoked   No smoking   No Alcohol   Caffeine : yes soda ,diet coke -occas.   No recreational drug use.   No exercise   Occupation : Adult nurse   Married , Yes  daughter recent grad [employer]   Children 1 daughter age 41 and 1 son age 33   Sexually active : currently sexually active   No HRT   GYN History..   Last pap smear date 2009   Last mammogram date 10/.21/2009   Abnormal pap smear none.                  Social Determinants of Health   Financial Resource Strain: Not on file  Food Insecurity: Not on file  Transportation Needs: Not on file  Physical Activity: Not on file  Stress: Not on file  Social Connections: Not on file  Intimate Partner Violence: Not on file     ROS- All systems are reviewed and negative except as per the HPI above.  Physical Exam: Vitals:   03/02/20 1536  BP: 122/78  Pulse: (!) 121  Weight: 124.9 kg  Height: 5\' 3"  (1.6 m)    GEN- The patient is well appearing obese female, alert and oriented x 3 today.   HEENT-head normocephalic, atraumatic, sclera clear, conjunctiva pink, hearing intact, trachea midline. Lungs- Clear to ausculation bilaterally, normal work of breathing Heart- irregular rate and rhythm, no murmurs, rubs or gallops  GI- soft, NT, ND, + BS Extremities- no  clubbing, cyanosis, or edema MS- no significant deformity or atrophy Skin- no rash or lesion Psych- euthymic mood, full affect Neuro- strength and sensation are intact   Wt Readings from Last 3 Encounters:  03/02/20 124.9 kg  02/29/20 124.3 kg  02/17/20 124 kg    EKG today demonstrates  Afib  Vent. rate 121 BPM QRS duration 78 ms QT/QTc 334/474 ms   Echo 02/07/20 demonstrated  1. Left ventricular ejection fraction, by estimation, is 55 to 60%. The  left ventricle has normal function. The left ventricle has no regional  wall motion abnormalities. There is mild concentric left ventricular  hypertrophy. Left ventricular diastolic  parameters are indeterminate.  2. Right ventricular systolic function is normal. The right ventricular  size is normal. There is normal pulmonary artery systolic pressure.  3. Planimetry has underestimated the atrial size: manual measurement is would yield a index volume of 45 cc/BSA. Left atrial size was mild to  moderately dilated.  4. Planimetry has underestimated the atrial size: manual measurement is  would yield a index volume of 23 cc/BSA.  5. The mitral valve is grossly normal. Trivial mitral valve  regurgitation.  6. The aortic valve is tricuspid. Aortic valve regurgitation is not  visualized. No aortic stenosis is present.  7. The inferior vena cava is normal in size with greater than 50%  respiratory variability, suggesting right atrial pressure of 3 mmHg.   Epic records are reviewed at length today  CHA2DS2-VASc Score = 3  The patient's score is based upon: CHF History: No HTN History: Yes Diabetes History: No Stroke History: No Vascular Disease History: No Age Score: 1 Gender Score: 1      ASSESSMENT AND PLAN: 1. Persistent Atrial Fibrillation (ICD10:  I48.19) The patient's CHA2DS2-VASc score is 3, indicating a 3.2% annual risk of stroke.   S/p DCCV 02/01/20 with early return of afib. We discussed therapeutic options  again today including flecainide, DCCV, and rate control. QT 486 in SR, will need to monitor closely.  Will plan to start flecainide 50 mg BID. If she remains persistent, will increase dose and repeat DCCV. If her CTA shows an elevated calcium score, may need to stop flecainide and consider amiodarone.  Increase Toprol to 100 mg BID for rate control. Continue Xarelto 20 mg daily  2. Secondary Hypercoagulable State (ICD10:  D68.69) The patient is at significant risk for stroke/thromboembolism based upon her CHA2DS2-VASc Score of 3.  Continue Rivaroxaban (Xarelto).   3. Obesity Body mass index is 48.78 kg/m. Lifestyle modification was discussed and encouraged including regular physical activity and weight reduction.  4. Suspected OSA Sleep study pending.  5. HTN Stable, med changes as above.    Follow up in the AF clinic 03/06/20 for ECG.    Megan Loa PA-C [facility] [address] [place], [place] [address] [phone] 03/02/2020 4:12 PM

## 2020-03-02 NOTE — H&P (View-Only) (Signed)
Primary Care Physician: Mila Palmer, MD Primary Cardiologist: Dr Bjorn Pippin Primary Electrophysiologist: none Referring Physician: Dr Donnamarie Poag Megan Waters is a 70 y.o. female with a history of hypertension, hyperlipidemia, peptic ulcer disease, and atrial fibrillation who presents for follow up in the [facility]. The patient was initially diagnosed with atrial fibrillation 01/07/20 after presenting with symptoms of increased fatigue. Patient is on Xarelto for a CHADS2VASC score of 3. She underwent DCCV on 02/01/20. Unfortunately, she felt she was back out of rhythm 02/04/20. She was under a considerable amount of stress, both at home and at work, which she feels contributed to her afib recurrence. She does feel that her symptoms of fatigue did improved while in SR. She has a sleep study upcoming.   On follow up today, patient remains in rapid afib. She reports that her home [employer] Simmonds mobile has shown heart rates in 90s-low 100s. She does have increased fatigue with afib. She denies any bleeding issues on anticoagulation.   Today, she denies symptoms of palpitations, chest pain, shortness of breath, orthopnea, PND, lower extremity edema, dizziness, presyncope, syncope, bleeding, or neurologic sequela. The patient is tolerating medications without difficulties and is otherwise without complaint today.    Atrial Fibrillation Risk Factors:  she does have symptoms or diagnosis of sleep apnea. Sleep study pending  she does not have a history of rheumatic fever. The patient does not have a history of early familial atrial fibrillation or other arrhythmias.  she has a BMI of Body mass index is 48.78 kg/m.Marland Kitchen Filed Weights   03/02/20 1536  Weight: 124.9 kg    No family history on file.   Atrial Fibrillation Management history:  Previous antiarrhythmic drugs: none Previous cardioversions: 02/01/20 Previous ablations: none CHADS2VASC score: 3 Anticoagulation  history: Xarelto   Past Medical History:  Diagnosis Date  . Atrophic vaginitis   . Chronic edema    no DVT  . GERD (gastroesophageal reflux disease)   . Hyperlipidemia   . Hypertension    echo - 12/22/2006- Normal EF 60% mild left atrial enlargement ,difficult windows   . PUD (peptic ulcer disease)    antral ulcer on EGD 2008- Dr Loreta Ave  . Sessile colonic polyp    personal hx  . Unspecified essential hypertension    nuclear stress-12/22/06 - low risk   . Urge and stress incontinence   . Vitamin D deficiency    Past Surgical History:  Procedure Laterality Date  . CARDIOVERSION N/A 02/01/2020   Procedure: CARDIOVERSION;  Surgeon: Little Ishikawa, MD;  Location: [facility] ENDOSCOPY;  Service: Cardiovascular;  Laterality: N/A;  . TEE WITHOUT CARDIOVERSION N/A 02/01/2020   Procedure: TRANSESOPHAGEAL ECHOCARDIOGRAM (TEE);  Surgeon: Little Ishikawa, MD;  Location: [facility] ENDOSCOPY;  Service: Cardiovascular;  Laterality: N/A;    Current Outpatient Medications  Medication Sig Dispense Refill  . BD PEN NEEDLE NANO 2ND GEN 32G X 4 MM MISC USE WITH SAXENDA EVERY DAY    . Cholecalciferol (VITAMIN D-3) 125 MCG (5000 UT) TABS Take 5,000 Units by mouth daily at 12 noon.    . docusate sodium (COLACE) 100 MG capsule Take 100 mg by mouth daily.    . flecainide (TAMBOCOR) 50 MG tablet Take 1 tablet (50 mg total) by mouth 2 (two) times daily. 60 tablet 3  . rivaroxaban (XARELTO) 20 MG TABS tablet Take 1 tablet (20 mg total) by mouth daily with supper. 90 tablet 3  . tolterodine (DETROL LA) 4 MG 24 hr  capsule Take 4 mg by mouth daily.    . vitamin E 180 MG (400 UNITS) capsule Take 400 Units by mouth daily.    . metoprolol succinate (TOPROL-XL) 50 MG 24 hr tablet Take 2 tablets (100 mg total) by mouth in the morning and at bedtime. 120 tablet 1   No current facility-administered medications for this encounter.    Allergies  Allergen Reactions  . Neosporin [Neomycin-Bacitracin Zn-Polymyx]     (  triple) WBC inflamation: allergy    Social History   Socioeconomic History  . Marital status: Married    Spouse name: Not on file  . Number of children: Not on file  . Years of education: Not on file  . Highest education level: Not on file  Occupational History  . Not on file  Tobacco Use  . Smoking status: Never Smoker  . Smokeless tobacco: Never Used  Substance and Sexual Activity  . Alcohol use: Yes    Alcohol/week: 1.0 standard drink    Types: 1 Standard drinks or equivalent per week  . Drug use: Never  . Sexual activity: Yes    Partners: Male  Other Topics Concern  . Not on file  Social History Narrative   History of smoking: Never smoked   No smoking   No Alcohol   Caffeine : yes soda ,diet coke -occas.   No recreational drug use.   No exercise   Occupation : Pathologist [employer]   Married , Yes  daughter recent grad [employer]   Children 1 daughter age 26 and 1 son age 29   Sexually active : currently sexually active   No HRT   GYN History..   Last pap smear date 2009   Last mammogram date 10/.21/2009   Abnormal pap smear none.                  Social Determinants of Health   Financial Resource Strain: Not on file  Food Insecurity: Not on file  Transportation Needs: Not on file  Physical Activity: Not on file  Stress: Not on file  Social Connections: Not on file  Intimate Partner Violence: Not on file     ROS- All systems are reviewed and negative except as per the HPI above.  Physical Exam: Vitals:   03/02/20 1536  BP: 122/78  Pulse: (!) 121  Weight: 124.9 kg  Height: 5' 3" (1.6 m)    GEN- The patient is well appearing obese female, alert and oriented x 3 today.   HEENT-head normocephalic, atraumatic, sclera clear, conjunctiva pink, hearing intact, trachea midline. Lungs- Clear to ausculation bilaterally, normal work of breathing Heart- irregular rate and rhythm, no murmurs, rubs or gallops  GI- soft, NT, ND, + BS Extremities- no  clubbing, cyanosis, or edema MS- no significant deformity or atrophy Skin- no rash or lesion Psych- euthymic mood, full affect Neuro- strength and sensation are intact   Wt Readings from Last 3 Encounters:  03/02/20 124.9 kg  02/29/20 124.3 kg  02/17/20 124 kg    EKG today demonstrates  Afib  Vent. rate 121 BPM QRS duration 78 ms QT/QTc 334/474 ms   Echo 02/07/20 demonstrated  1. Left ventricular ejection fraction, by estimation, is 55 to 60%. The  left ventricle has normal function. The left ventricle has no regional  wall motion abnormalities. There is mild concentric left ventricular  hypertrophy. Left ventricular diastolic  parameters are indeterminate.  2. Right ventricular systolic function is normal. The right ventricular    size is normal. There is normal pulmonary artery systolic pressure.  3. Planimetry has underestimated the atrial size: manual measurement is would yield a index volume of 45 cc/BSA. Left atrial size was mild to  moderately dilated.  4. Planimetry has underestimated the atrial size: manual measurement is  would yield a index volume of 23 cc/BSA.  5. The mitral valve is grossly normal. Trivial mitral valve  regurgitation.  6. The aortic valve is tricuspid. Aortic valve regurgitation is not  visualized. No aortic stenosis is present.  7. The inferior vena cava is normal in size with greater than 50%  respiratory variability, suggesting right atrial pressure of 3 mmHg.   Epic records are reviewed at length today  CHA2DS2-VASc Score = 3  The patient's score is based upon: CHF History: No HTN History: Yes Diabetes History: No Stroke History: No Vascular Disease History: No Age Score: 1 Gender Score: 1      ASSESSMENT AND PLAN: 1. Persistent Atrial Fibrillation (ICD10:  I48.19) The patient's CHA2DS2-VASc score is 3, indicating a 3.2% annual risk of stroke.   S/p DCCV 02/01/20 with early return of afib. We discussed therapeutic options  again today including flecainide, DCCV, and rate control. QT 486 in SR, will need to monitor closely.  Will plan to start flecainide 50 mg BID. If she remains persistent, will increase dose and repeat DCCV. If her CTA shows an elevated calcium score, may need to stop flecainide and consider amiodarone.  Increase Toprol to 100 mg BID for rate control. Continue Xarelto 20 mg daily  2. Secondary Hypercoagulable State (ICD10:  D68.69) The patient is at significant risk for stroke/thromboembolism based upon her CHA2DS2-VASc Score of 3.  Continue Rivaroxaban (Xarelto).   3. Obesity Body mass index is 48.78 kg/m. Lifestyle modification was discussed and encouraged including regular physical activity and weight reduction.  4. Suspected OSA Sleep study pending.  5. HTN Stable, med changes as above.    Follow up in the AF clinic 03/06/20 for ECG.    Jorja Loa PA-C [facility] [address] [place], [place] [address] [phone] 03/02/2020 4:12 PM

## 2020-03-06 ENCOUNTER — Other Ambulatory Visit: Payer: Self-pay

## 2020-03-06 ENCOUNTER — Ambulatory Visit (HOSPITAL_COMMUNITY)
Admission: RE | Admit: 2020-03-06 | Discharge: 2020-03-06 | Disposition: A | Discharge status: Home / Self Care | Payer: BC Managed Care – PPO | Source: Ambulatory Visit | Attending: Physician Assistant | Admitting: Physician Assistant

## 2020-03-06 VITALS — BP 126/98 | HR 98

## 2020-03-06 DIAGNOSIS — I4891 Unspecified atrial fibrillation: Secondary | ICD-10-CM | POA: Insufficient documentation

## 2020-03-06 DIAGNOSIS — I4819 Other persistent atrial fibrillation: Secondary | ICD-10-CM

## 2020-03-06 LAB — CBC
HCT: 44.8 % (ref 36.0–46.0)
Hemoglobin: 15.9 g/dL — ABNORMAL HIGH (ref 12.0–15.0)
MCH: 31.7 pg (ref 26.0–34.0)
MCHC: 35.5 g/dL (ref 30.0–36.0)
MCV: 89.4 fL (ref 80.0–100.0)
Platelets: 226 10*3/uL (ref 150–400)
RBC: 5.01 MIL/uL (ref 3.87–5.11)
RDW: 12.6 % (ref 11.5–15.5)
WBC: 8.4 10*3/uL (ref 4.0–10.5)
nRBC: 0 % (ref 0.0–0.2)

## 2020-03-06 LAB — BASIC METABOLIC PANEL
Anion gap: 9 (ref 5–15)
BUN: 21 mg/dL (ref 8–23)
CO2: 26 mmol/L (ref 22–32)
Calcium: 9.1 mg/dL (ref 8.9–10.3)
Chloride: 106 mmol/L (ref 98–111)
Creatinine, Ser: 0.9 mg/dL (ref 0.44–1.00)
GFR, Estimated: >60 mL/min (ref >60)
Glucose, Bld: 95 mg/dL (ref 70–99)
Potassium: 4.7 mmol/L (ref 3.5–5.1)
Sodium: 141 mmol/L (ref 135–145)

## 2020-03-06 MED ORDER — FLECAINIDE ACETATE 50 MG PO TABS: 100.0000 mg | ORAL_TABLET | Freq: Two times a day (BID) | ORAL | 3 refills | Status: DC

## 2020-03-06 NOTE — Progress Notes (Signed)
Patient returns for ECG after starting flecainide. ECG shows afib HR 98, QRS 86, QTc 451. Will increase flecainide to 100 mg BID. DCCV already scheduled. Check bmet/cbc. Follow up in the AF clinic as scheduled.  

## 2020-03-06 NOTE — H&P (View-Only) (Signed)
Patient returns for ECG after starting flecainide. ECG shows afib HR 98, QRS 86, QTc 451. Will increase flecainide to 100 mg BID. DCCV already scheduled. Check bmet/cbc. Follow up in the AF clinic as scheduled.

## 2020-03-06 NOTE — Patient Instructions (Signed)
Cardioversion scheduled for Thursday, February 10th             - Arrive at the [facility] and go to admitting at 830AM             - Do not eat or drink anything after midnight the night prior to your procedure.             - Take all your morning medication (except diabetic medications) with a sip of water prior to arrival.             - You will not be able to drive home after your procedure.             - Do NOT miss any doses of your blood thinner - if you should miss a dose please notify our office immediately.             - If you feel as if you go back into normal rhythm prior to scheduled cardioversion, please notify our office immediately. If your procedure is canceled in the cardioversion suite you will be charged a cancellation fee.    Increase flecainide to 100mg  twice a day

## 2020-03-07 ENCOUNTER — Other Ambulatory Visit (HOSPITAL_COMMUNITY)
Admission: RE | Admit: 2020-03-07 | Discharge: 2020-03-07 | Disposition: A | Discharge status: Home / Self Care | Payer: BC Managed Care – PPO | Source: Ambulatory Visit | Attending: Cardiology | Admitting: Cardiology

## 2020-03-07 DIAGNOSIS — Z20822 Contact with and (suspected) exposure to covid-19: Secondary | ICD-10-CM | POA: Insufficient documentation

## 2020-03-07 DIAGNOSIS — I4891 Unspecified atrial fibrillation: Secondary | ICD-10-CM | POA: Diagnosis not present

## 2020-03-07 DIAGNOSIS — Z01812 Encounter for preprocedural laboratory examination: Secondary | ICD-10-CM | POA: Insufficient documentation

## 2020-03-07 LAB — SARS CORONAVIRUS 2 (TAT 6-24 HRS): SARS Coronavirus 2: NEGATIVE

## 2020-03-09 ENCOUNTER — Other Ambulatory Visit: Payer: Self-pay

## 2020-03-09 ENCOUNTER — Encounter (HOSPITAL_COMMUNITY)
Admission: RE | Disposition: A | Discharge status: Home / Self Care | Payer: Self-pay | Source: Home / Self Care | Attending: Cardiology

## 2020-03-09 ENCOUNTER — Ambulatory Visit (HOSPITAL_COMMUNITY)
Admission: RE | Admit: 2020-03-09 | Discharge: 2020-03-09 | Disposition: A | Discharge status: Home / Self Care | Payer: BC Managed Care – PPO | Attending: Cardiology | Admitting: Cardiology

## 2020-03-09 ENCOUNTER — Ambulatory Visit (HOSPITAL_COMMUNITY): Payer: BC Managed Care – PPO | Admitting: Certified Registered Nurse Anesthetist

## 2020-03-09 ENCOUNTER — Encounter (HOSPITAL_COMMUNITY): Payer: Self-pay | Admitting: Cardiology

## 2020-03-09 DIAGNOSIS — I4891 Unspecified atrial fibrillation: Secondary | ICD-10-CM | POA: Diagnosis not present

## 2020-03-09 DIAGNOSIS — E559 Vitamin D deficiency, unspecified: Secondary | ICD-10-CM | POA: Diagnosis not present

## 2020-03-09 DIAGNOSIS — E785 Hyperlipidemia, unspecified: Secondary | ICD-10-CM | POA: Diagnosis not present

## 2020-03-09 DIAGNOSIS — Z20822 Contact with and (suspected) exposure to covid-19: Secondary | ICD-10-CM | POA: Diagnosis not present

## 2020-03-09 DIAGNOSIS — I10 Essential (primary) hypertension: Secondary | ICD-10-CM | POA: Diagnosis not present

## 2020-03-09 HISTORY — PX: CARDIOVERSION: SHX1299

## 2020-03-09 SURGERY — CARDIOVERSION
Anesthesia: General

## 2020-03-09 MED ORDER — LIDOCAINE HCL (CARDIAC) PF 100 MG/5ML IV SOSY
PREFILLED_SYRINGE | INTRAVENOUS | Status: DC | PRN
  Administered 2020-03-09: 20 mg via INTRATRACHEAL

## 2020-03-09 MED ORDER — PROPOFOL 10 MG/ML IV BOLUS
INTRAVENOUS | Status: DC | PRN
  Administered 2020-03-09: 80 mg via INTRAVENOUS

## 2020-03-09 MED ORDER — SODIUM CHLORIDE 0.9 % IV SOLN: INTRAVENOUS | Status: DC | PRN

## 2020-03-09 NOTE — Anesthesia Preprocedure Evaluation (Signed)
Anesthesia Evaluation  Patient identified by MRN, date of birth, ID band Patient awake    Reviewed: Allergy & Precautions, NPO status , Patient's Chart, lab work & pertinent test results, reviewed documented beta blocker date and time   History of Anesthesia Complications Negative for: history of anesthetic complications  Airway Mallampati: II  TM Distance: >3 FB Neck ROM: Full    Dental  (+) Dental Advisory Given   Pulmonary neg pulmonary ROS,  03/07/2020 SARS coronavirus NEG   breath sounds clear to auscultation       Cardiovascular hypertension, Pt. on medications and Pt. on home beta blockers (-) angina Rhythm:Irregular Rate:Normal     Neuro/Psych negative neurological ROS     GI/Hepatic Neg liver ROS, GERD  Controlled,  Endo/Other  Morbid obesity  Renal/GU negative Renal ROS     Musculoskeletal   Abdominal (+) + obese,   Peds  Hematology xarelto   Anesthesia Other Findings   Reproductive/Obstetrics                             Anesthesia Physical Anesthesia Plan  ASA: III  Anesthesia Plan: General   Post-op Pain Management:    Induction: Intravenous  PONV Risk Score and Plan: 3 and Treatment may vary due to age or medical condition  Airway Management Planned: Natural Airway and Mask  Additional Equipment: None  Intra-op Plan:   Post-operative Plan:   Informed Consent: I have reviewed the patients History and Physical, chart, labs and discussed the procedure including the risks, benefits and alternatives for the proposed anesthesia with the patient or authorized representative who has indicated his/her understanding and acceptance.     Dental advisory given  Plan Discussed with: CRNA and Surgeon  Anesthesia Plan Comments:         Anesthesia Quick Evaluation

## 2020-03-09 NOTE — Interval H&P Note (Signed)
History and Physical Interval Note:  03/09/2020 8:41 AM  Megan Waters  has presented today for surgery, with the diagnosis of A-FIB.  The various methods of treatment have been discussed with the patient and family. After consideration of risks, benefits and other options for treatment, the patient has consented to  Procedure(s): CARDIOVERSION (N/A) as a surgical intervention.  The patient's history has been reviewed, patient examined, no change in status, stable for surgery.  I have reviewed the patient's chart and labs.  Questions were answered to the patient's satisfaction.     Tobias Alexander

## 2020-03-09 NOTE — Discharge Instructions (Signed)
Electrical Cardioversion Electrical cardioversion is the delivery of a jolt of electricity to restore a normal rhythm to the heart. A rhythm that is too fast or is not regular keeps the heart from pumping well. In this procedure, sticky patches or metal paddles are placed on the chest to deliver electricity to the heart from a device. This procedure may be done in an emergency if:  There is low or no blood pressure as a result of the heart rhythm.  Normal rhythm must be restored as fast as possible to protect the brain and heart from further damage.  It may save a life. This may also be a scheduled procedure for irregular or fast heart rhythms that are not immediately life-threatening. Tell a health care provider about:  Any allergies you have.  All medicines you are taking, including vitamins, herbs, eye drops, creams, and over-the-counter medicines.  Any problems you or family members have had with anesthetic medicines.  Any blood disorders you have.  Any surgeries you have had.  Any medical conditions you have.  Whether you are pregnant or may be pregnant. What are the risks? Generally, this is a safe procedure. However, problems may occur, including:  Allergic reactions to medicines.  A blood clot that breaks free and travels to other parts of your body.  The possible return of an abnormal heart rhythm within hours or days after the procedure.  Your heart stopping (cardiac arrest). This is rare. What happens before the procedure? Medicines  Your health care provider may have you start taking: ? Blood-thinning medicines (anticoagulants) so your blood does not clot as easily. ? Medicines to help stabilize your heart rate and rhythm.  Ask your health care provider about: ? Changing or stopping your regular medicines. This is especially important if you are taking diabetes medicines or blood thinners. ? Taking medicines such as aspirin and ibuprofen. These medicines can  thin your blood. Do not take these medicines unless your health care provider tells you to take them. ? Taking over-the-counter medicines, vitamins, herbs, and supplements. General instructions  Follow instructions from your health care provider about eating or drinking restrictions.  Plan to have someone take you home from the hospital or clinic.  If you will be going home right after the procedure, plan to have someone with you for 24 hours.  Ask your health care provider what steps will be taken to help prevent infection. These may include washing your skin with a germ-killing soap. What happens during the procedure?  An IV will be inserted into one of your veins.  Sticky patches (electrodes) or metal paddles may be placed on your chest.  You will be given a medicine to help you relax (sedative).  An electrical shock will be delivered. The procedure may vary among health care providers and hospitals.   What can I expect after the procedure?  Your blood pressure, heart rate, breathing rate, and blood oxygen level will be monitored until you leave the hospital or clinic.  Your heart rhythm will be watched to make sure it does not change.  You may have some redness on the skin where the shocks were given. Follow these instructions at home:  Do not drive for 24 hours if you were given a sedative during your procedure.  Take over-the-counter and prescription medicines only as told by your health care provider.  Ask your health care provider how to check your pulse. Check it often.  Rest for 48 hours after the procedure   or as told by your health care provider.  Avoid or limit your caffeine use as told by your health care provider.  Keep all follow-up visits as told by your health care provider. This is important. Contact a health care provider if:  You feel like your heart is beating too quickly or your pulse is not regular.  You have a serious muscle cramp that does not go  away. Get help right away if:  You have discomfort in your chest.  You are dizzy or you feel faint.  You have trouble breathing or you are short of breath.  Your speech is slurred.  You have trouble moving an arm or leg on one side of your body.  Your fingers or toes turn cold or blue. Summary  Electrical cardioversion is the delivery of a jolt of electricity to restore a normal rhythm to the heart.  This procedure may be done right away in an emergency or may be a scheduled procedure if the condition is not an emergency.  Generally, this is a safe procedure.  After the procedure, check your pulse often as told by your health care provider. This information is not intended to replace advice given to you by your health care provider. Make sure you discuss any questions you have with your health care provider. Document Revised: 08/17/2018 Document Reviewed: 08/17/2018 Elsevier Patient Education  2021 Elsevier Inc.  

## 2020-03-09 NOTE — CV Procedure (Signed)
    Cardioversion Note  Cassidi Modesitt Coate [id number] 1950/03/26  Procedure: DC Cardioversion Indications: atrial fibrillation  Procedure Details Consent: Obtained Time Out: Verified patient identification, verified procedure, site/side was marked, verified correct patient position, special equipment/implants available, Radiology Safety Procedures followed,  medications/allergies/relevent history reviewed, required imaging and test results available.  Performed  The patient has been on adequate anticoagulation.  The patient received IV Propofol 80 mg and IV Lidocaine 20 mg for sedation.  Synchronous cardioversion was performed at 150 joules.  The cardioversion was successful.   Complications: No apparent complications Patient did tolerate procedure well.   Tobias Alexander, MD, [facility] 03/09/2020, 9:41 AM

## 2020-03-09 NOTE — Interval H&P Note (Signed)
History and Physical Interval Note:  03/09/2020 8:40 AM  Megan Waters  has presented today for surgery, with the diagnosis of A-FIB.  The various methods of treatment have been discussed with the patient and family. After consideration of risks, benefits and other options for treatment, the patient has consented to  Procedure(s): CARDIOVERSION (N/A) as a surgical intervention.  The patient's history has been reviewed, patient examined, no change in status, stable for surgery.  I have reviewed the patient's chart and labs.  Questions were answered to the patient's satisfaction.     Tobias Alexander

## 2020-03-09 NOTE — Transfer of Care (Signed)
Immediate Anesthesia Transfer of Care Note  Patient: Megan Waters  Procedure(s) Performed: CARDIOVERSION (N/A )  Patient Location: Endoscopy Unit  Anesthesia Type:General  Level of Consciousness: drowsy and patient cooperative  Airway & Oxygen Therapy: Patient Spontanous Breathing  Post-op Assessment: Report given to RN and Post -op Vital signs reviewed and stable  Post vital signs: Reviewed and stable  Last Vitals:  Vitals Value Taken Time  BP 115/76   Temp    Pulse 61   Resp 10   SpO2 97     Last Pain:  Vitals:   03/09/20 0805  TempSrc: Tympanic  PainSc: 0-No pain         Complications: No complications documented.

## 2020-03-09 NOTE — Anesthesia Postprocedure Evaluation (Signed)
Anesthesia Post Note  Patient: Megan Waters  Procedure(s) Performed: CARDIOVERSION (N/A )     Patient location during evaluation: PACU Anesthesia Type: General Level of consciousness: awake and alert, patient cooperative and oriented Pain management: pain level controlled Vital Signs Assessment: post-procedure vital signs reviewed and stable Respiratory status: spontaneous breathing, nonlabored ventilation and respiratory function stable Cardiovascular status: blood pressure returned to baseline and stable Postop Assessment: no apparent nausea or vomiting and able to ambulate Anesthetic complications: no   No complications documented.  Last Vitals:  Vitals:   03/09/20 0805 03/09/20 0945  BP: 121/67 105/66  Pulse: 92 63  Resp: 16 14  Temp: (!) 36.4 C 36.5 C  SpO2: 100% 100%    Last Pain:  Vitals:   03/09/20 0945  TempSrc: Axillary  PainSc: 0-No pain                 Roldan Laforest,E. Walden Statz

## 2020-03-09 NOTE — Anesthesia Procedure Notes (Signed)
Procedure Name: General with mask airway Date/Time: 03/09/2020 9:32 AM Performed by: Modena Morrow, CRNA Pre-anesthesia Checklist: Patient identified, Emergency Drugs available, Suction available and Patient being monitored Patient Re-evaluated:Patient Re-evaluated prior to induction Oxygen Delivery Method: Ambu bag Preoxygenation: Pre-oxygenation with 100% oxygen Induction Type: IV induction Ventilation: Mask ventilation without difficulty Placement Confirmation: positive ETCO2

## 2020-03-10 ENCOUNTER — Other Ambulatory Visit: Payer: Self-pay

## 2020-03-10 ENCOUNTER — Ambulatory Visit (HOSPITAL_BASED_OUTPATIENT_CLINIC_OR_DEPARTMENT_OTHER): Payer: BC Managed Care – PPO | Attending: Cardiology | Admitting: Cardiovascular Disease

## 2020-03-10 DIAGNOSIS — G478 Other sleep disorders: Secondary | ICD-10-CM | POA: Insufficient documentation

## 2020-03-10 DIAGNOSIS — G4736 Sleep related hypoventilation in conditions classified elsewhere: Secondary | ICD-10-CM | POA: Diagnosis not present

## 2020-03-10 DIAGNOSIS — G4730 Sleep apnea, unspecified: Secondary | ICD-10-CM

## 2020-03-10 DIAGNOSIS — R0683 Snoring: Secondary | ICD-10-CM | POA: Diagnosis not present

## 2020-03-10 DIAGNOSIS — Z7901 Long term (current) use of anticoagulants: Secondary | ICD-10-CM | POA: Diagnosis not present

## 2020-03-10 DIAGNOSIS — Z79899 Other long term (current) drug therapy: Secondary | ICD-10-CM | POA: Insufficient documentation

## 2020-03-10 DIAGNOSIS — I4891 Unspecified atrial fibrillation: Secondary | ICD-10-CM | POA: Diagnosis not present

## 2020-03-12 ENCOUNTER — Encounter (HOSPITAL_COMMUNITY): Payer: Self-pay | Admitting: Cardiology

## 2020-03-13 ENCOUNTER — Other Ambulatory Visit (HOSPITAL_BASED_OUTPATIENT_CLINIC_OR_DEPARTMENT_OTHER): Payer: Self-pay

## 2020-03-13 DIAGNOSIS — R0683 Snoring: Secondary | ICD-10-CM

## 2020-03-13 DIAGNOSIS — I4891 Unspecified atrial fibrillation: Secondary | ICD-10-CM

## 2020-03-16 ENCOUNTER — Ambulatory Visit (HOSPITAL_COMMUNITY)
Admission: RE | Admit: 2020-03-16 | Discharge: 2020-03-16 | Disposition: A | Discharge status: Home / Self Care | Payer: BC Managed Care – PPO | Source: Ambulatory Visit | Attending: Physician Assistant | Admitting: Physician Assistant

## 2020-03-16 ENCOUNTER — Encounter (HOSPITAL_COMMUNITY): Payer: Self-pay | Admitting: Physician Assistant

## 2020-03-16 ENCOUNTER — Other Ambulatory Visit: Payer: Self-pay

## 2020-03-16 VITALS — BP 134/80 | HR 62 | Ht 63.0 in | Wt 278.4 lb

## 2020-03-16 DIAGNOSIS — I10 Essential (primary) hypertension: Secondary | ICD-10-CM | POA: Insufficient documentation

## 2020-03-16 DIAGNOSIS — Z6841 Body Mass Index (BMI) 40.0 and over, adult: Secondary | ICD-10-CM | POA: Insufficient documentation

## 2020-03-16 DIAGNOSIS — E785 Hyperlipidemia, unspecified: Secondary | ICD-10-CM | POA: Insufficient documentation

## 2020-03-16 DIAGNOSIS — K279 Peptic ulcer, site unspecified, unspecified as acute or chronic, without hemorrhage or perforation: Secondary | ICD-10-CM | POA: Insufficient documentation

## 2020-03-16 DIAGNOSIS — Z7901 Long term (current) use of anticoagulants: Secondary | ICD-10-CM | POA: Insufficient documentation

## 2020-03-16 DIAGNOSIS — E669 Obesity, unspecified: Secondary | ICD-10-CM | POA: Insufficient documentation

## 2020-03-16 DIAGNOSIS — Z79899 Other long term (current) drug therapy: Secondary | ICD-10-CM | POA: Insufficient documentation

## 2020-03-16 DIAGNOSIS — D6869 Other thrombophilia: Secondary | ICD-10-CM | POA: Insufficient documentation

## 2020-03-16 DIAGNOSIS — I4819 Other persistent atrial fibrillation: Secondary | ICD-10-CM | POA: Insufficient documentation

## 2020-03-16 MED ORDER — METOPROLOL SUCCINATE ER 50 MG PO TB24: 75.0000 mg | ORAL_TABLET | Freq: Two times a day (BID) | ORAL | 1 refills | Status: DC

## 2020-03-16 NOTE — Progress Notes (Signed)
Primary Care Physician: Mila Palmer, MD Primary Cardiologist: Dr Bjorn Pippin Primary Electrophysiologist: none Referring Physician: Dr Donnamarie Poag Megan Waters is a 70 y.o. female with a history of hypertension, hyperlipidemia, peptic ulcer disease, and atrial fibrillation who presents for follow up in the [facility]. The patient was initially diagnosed with atrial fibrillation 01/07/20 after presenting with symptoms of increased fatigue. Patient is on Xarelto for a CHADS2VASC score of 3. She underwent DCCV on 02/01/20. Unfortunately, she felt she was back out of rhythm 02/04/20. She was under a considerable amount of stress, both at home and at work, which she feels contributed to her afib recurrence. She does feel that her symptoms of fatigue did improved while in SR. She has a sleep study upcoming.   On follow up today, patient is s/p DCCV on 03/09/20 after starting flecainide. She is in SR today although she does feel more fatigued since increase metoprolol. She has also noticed ~6-7 lbs weight gain. She reports her chronic lower extremity edema is at baseline.   Today, she denies symptoms of palpitations, chest pain, shortness of breath, orthopnea, PND, lower extremity edema, dizziness, presyncope, syncope, bleeding, or neurologic sequela. The patient is tolerating medications without difficulties and is otherwise without complaint today.    Atrial Fibrillation Risk Factors:  she does have symptoms or diagnosis of sleep apnea. Sleep study pending  she does not have a history of rheumatic fever. The patient does not have a history of early familial atrial fibrillation or other arrhythmias.  she has a BMI of Body mass index is 49.32 kg/m.Marland Kitchen Filed Weights   03/16/20 1459  Weight: 126.3 kg    No family history on file.   Atrial Fibrillation Management history:  Previous antiarrhythmic drugs: flecainide  Previous cardioversions: 02/01/20,  03/09/20 Previous ablations: none CHADS2VASC score: 3 Anticoagulation history: Xarelto   Past Medical History:  Diagnosis Date  . Atrophic vaginitis   . Chronic edema    no DVT  . GERD (gastroesophageal reflux disease)   . Hyperlipidemia   . Hypertension    echo - 12/22/2006- Normal EF 60% mild left atrial enlargement ,difficult windows   . PUD (peptic ulcer disease)    antral ulcer on EGD 2008- Dr Loreta Ave  . Sessile colonic polyp    personal hx  . Unspecified essential hypertension    nuclear stress-12/22/06 - low risk   . Urge and stress incontinence   . Vitamin D deficiency    Past Surgical History:  Procedure Laterality Date  . CARDIOVERSION N/A 02/01/2020   Procedure: CARDIOVERSION;  Surgeon: Little Ishikawa, MD;  Location: [facility] ENDOSCOPY;  Service: Cardiovascular;  Laterality: N/A;  . CARDIOVERSION N/A 03/09/2020   Procedure: CARDIOVERSION;  Surgeon: Lars Masson, MD;  Location: [facility] ENDOSCOPY;  Service: Cardiovascular;  Laterality: N/A;  . TEE WITHOUT CARDIOVERSION N/A 02/01/2020   Procedure: TRANSESOPHAGEAL ECHOCARDIOGRAM (TEE);  Surgeon: Little Ishikawa, MD;  Location: [facility] ENDOSCOPY;  Service: Cardiovascular;  Laterality: N/A;    Current Outpatient Medications  Medication Sig Dispense Refill  . BD PEN NEEDLE NANO 2ND GEN 32G X 4 MM MISC USE WITH SAXENDA EVERY DAY    . Cholecalciferol (VITAMIN D-3) 125 MCG (5000 UT) TABS Take 5,000 Units by mouth daily at 12 noon.    . docusate sodium (COLACE) 100 MG capsule Take 100 mg by mouth daily.    . flecainide (TAMBOCOR) 50 MG tablet Take 2 tablets (100 mg total) by mouth 2 (two) times  daily. 60 tablet 3  . rivaroxaban (XARELTO) 20 MG TABS tablet Take 1 tablet (20 mg total) by mouth daily with supper. 90 tablet 3  . tolterodine (DETROL LA) 4 MG 24 hr capsule Take 4 mg by mouth daily.    . vitamin E 180 MG (400 UNITS) capsule Take 400 Units by mouth daily.    . metoprolol succinate (TOPROL-XL) 50 MG 24 hr tablet Take  1.5 tablets (75 mg total) by mouth in the morning and at bedtime. 120 tablet 1   No current facility-administered medications for this encounter.    Allergies  Allergen Reactions  . Neosporin [Neomycin-Bacitracin Zn-Polymyx]     ( triple) WBC inflamation: allergy    Social History   Socioeconomic History  . Marital status: Married    Spouse name: Not on file  . Number of children: Not on file  . Years of education: Not on file  . Highest education level: Not on file  Occupational History  . Not on file  Tobacco Use  . Smoking status: Never Smoker  . Smokeless tobacco: Never Used  Substance and Sexual Activity  . Alcohol use: Yes    Alcohol/week: 1.0 standard drink    Types: 1 Standard drinks or equivalent per week  . Drug use: Never  . Sexual activity: Yes    Partners: Male  Other Topics Concern  . Not on file  Social History Narrative   History of smoking: Never smoked   No smoking   No Alcohol   Caffeine : yes soda ,diet coke -occas.   No recreational drug use.   No exercise   Occupation : Adult nurse   Married , Yes  daughter recent grad [employer]   Children 1 daughter age 61 and 1 son age 56   Sexually active : currently sexually active   No HRT   GYN History..   Last pap smear date 2009   Last mammogram date 10/.21/2009   Abnormal pap smear none.                  Social Determinants of Health   Financial Resource Strain: Not on file  Food Insecurity: Not on file  Transportation Needs: Not on file  Physical Activity: Not on file  Stress: Not on file  Social Connections: Not on file  Intimate Partner Violence: Not on file     ROS- All systems are reviewed and negative except as per the HPI above.  Physical Exam: Vitals:   03/16/20 1459  BP: 134/80  Pulse: 62  Weight: 126.3 kg  Height: 5\' 3"  (1.6 m)    GEN- The patient is well appearing obese female, alert and oriented x 3 today.   HEENT-head normocephalic, atraumatic, sclera  clear, conjunctiva pink, hearing intact, trachea midline. Lungs- Clear to ausculation bilaterally, normal work of breathing Heart- Regular rate and rhythm, no murmurs, rubs or gallops  GI- soft, NT, ND, + BS Extremities- no clubbing, cyanosis, or edema MS- no significant deformity or atrophy Skin- no rash or lesion Psych- euthymic mood, full affect Neuro- strength and sensation are intact   Wt Readings from Last 3 Encounters:  03/16/20 126.3 kg  03/10/20 122.5 kg  03/09/20 124.9 kg    EKG today demonstrates  SR Vent. rate 62 BPM PR interval 182 ms QRS duration 94 ms QT/QTc 436/442 ms  Echo 02/07/20 demonstrated  1. Left ventricular ejection fraction, by estimation, is 55 to 60%. The  left ventricle has normal function.  The left ventricle has no regional  wall motion abnormalities. There is mild concentric left ventricular  hypertrophy. Left ventricular diastolic  parameters are indeterminate.  2. Right ventricular systolic function is normal. The right ventricular  size is normal. There is normal pulmonary artery systolic pressure.  3. Planimetry has underestimated the atrial size: manual measurement is would yield a index volume of 45 cc/BSA. Left atrial size was mild to  moderately dilated.  4. Planimetry has underestimated the atrial size: manual measurement is  would yield a index volume of 23 cc/BSA.  5. The mitral valve is grossly normal. Trivial mitral valve  regurgitation.  6. The aortic valve is tricuspid. Aortic valve regurgitation is not  visualized. No aortic stenosis is present.  7. The inferior vena cava is normal in size with greater than 50%  respiratory variability, suggesting right atrial pressure of 3 mmHg.   Epic records are reviewed at length today  CHA2DS2-VASc Score = 3  The patient's score is based upon: CHF History: No HTN History: Yes Diabetes History: No Stroke History: No Vascular Disease History: No Age Score: 1 Gender Score: 1       ASSESSMENT AND PLAN: 1. Persistent Atrial Fibrillation (ICD10:  I48.19) The patient's CHA2DS2-VASc score is 3, indicating a 3.2% annual risk of stroke.   S/p DCCV on 03/09/20 Patient is in SR Continue flecainide 100 mg BID If her CTA shows a significant calcium score, may need to consider alternative AAD. Decrease Toprol to 75 mg BID 2/2 fatigue. Continue Xarelto 20 mg daily Offered short course of Lasix for weight gain post DCCV, patient declined.   2. Secondary Hypercoagulable State (ICD10:  D68.69) The patient is at significant risk for stroke/thromboembolism based upon her CHA2DS2-VASc Score of 3.  Continue Rivaroxaban (Xarelto).   3. Obesity Body mass index is 49.32 kg/m. Lifestyle modification was discussed and encouraged including regular physical activity and weight reduction.  4. Suspected OSA Sleep study results pending.  5. HTN Stable, med changes as above.   Follow up with Dr Bjorn Pippin as scheduled.    Jorja Loa PA-C [facility] [address] [place], [place] [address] [phone] 03/16/2020 3:23 PM

## 2020-03-20 ENCOUNTER — Telehealth: Payer: Self-pay | Admitting: Cardiology

## 2020-03-20 ENCOUNTER — Other Ambulatory Visit: Payer: Self-pay

## 2020-03-20 ENCOUNTER — Ambulatory Visit (INDEPENDENT_AMBULATORY_CARE_PROVIDER_SITE_OTHER)
Admission: RE | Admit: 2020-03-20 | Discharge: 2020-03-20 | Disposition: A | Discharge status: Home / Self Care | Payer: Self-pay | Source: Ambulatory Visit | Attending: Cardiology | Admitting: Cardiology

## 2020-03-20 DIAGNOSIS — I4819 Other persistent atrial fibrillation: Secondary | ICD-10-CM

## 2020-03-20 DIAGNOSIS — I10 Essential (primary) hypertension: Secondary | ICD-10-CM

## 2020-03-20 NOTE — Telephone Encounter (Signed)
Patient c/o Palpitations:  High priority if patient c/o lightheadedness, shortness of breath, or chest pain  1) How long have you had palpitations/irregular HR/ Afib? Pt is back in afib since last Thursday night  2)  Are you having the symptoms now?  Pt denies any symptoms right now   3) Are you currently experiencing lightheadedness, SOB or CP? Pt Denies any of this symptoms at this time   4) Do you have a history of afib (atrial fibrillation) or irregular heart rhythm?  Yes   5) Have you checked your BP or HR? (document readings if available):  Hr is between 100 to 120   6) Are you experiencing any other symptoms? NA    Pt did go back up to the 100 mg twice daily for both meds   Best number  [phone]

## 2020-03-20 NOTE — Telephone Encounter (Signed)
Talked with patient - recent cardioversion. Felt herself convert back into Afib Thursday evening after follow up appointment.  Heart rates aren't staying over 100 all the time and pt has already returned to previous dose of metoprolol of 100mg . She is asymptomatic and would prefer to wait until next week to come in to discuss next steps for treatment of her AF. Pt will call if she should convert back into NSR on her own or if her symptoms worsen.

## 2020-03-21 NOTE — Interval H&P Note (Signed)
History and Physical Interval Note:  Updated with H&P note. See interval H&P by Dr Delton See 03/09/20.

## 2020-03-24 ENCOUNTER — Encounter (HOSPITAL_BASED_OUTPATIENT_CLINIC_OR_DEPARTMENT_OTHER): Payer: Self-pay | Admitting: Cardiovascular Disease

## 2020-03-24 NOTE — Procedures (Signed)
Patient Name: Prima, Rayner Date: 03/10/2020 Gender: Female D.O.B: 06/24/1950 Age (years): 69 Referring Provider: Epifanio Lesches Height (inches): 63 Interpreting Physician: Nicki Guadalajara MD, ABSM Weight (lbs): 270 RPSGT: Rolene Arbour BMI: 48 MRN: [id number] Neck Size: 14.00  CLINICAL INFORMATION Sleep Study Type: NPSG  Indication for sleep study: N/A  Epworth Sleepiness Score: 0  SLEEP STUDY TECHNIQUE As per the AASM Manual for the Scoring of Sleep and Associated Events v2.3 (April 2016) with a hypopnea requiring 4% desaturations.  The channels recorded and monitored were frontal, central and occipital EEG, electrooculogram (EOG), submentalis EMG (chin), nasal and oral airflow, thoracic and abdominal wall motion, anterior tibialis EMG, snore microphone, electrocardiogram, and pulse oximetry.  MEDICATIONS Cholecalciferol (VITAMIN D-3) 125 MCG (5000 UT) TABS docusate sodium (COLACE) 100 MG capsule flecainide (TAMBOCOR) 50 MG tablet metoprolol succinate (TOPROL-XL) 50 MG 24 hr tablet rivaroxaban (XARELTO) 20 MG TABS tablet tolterodine (DETROL LA) 4 MG 24 hr capsule vitamin E 180 MG (400 UNITS) capsule Medications self-administered by patient taken the night of the study : N/A  SLEEP ARCHITECTURE The study was initiated at 10:42:32 PM and ended at 4:54:20 AM.  Sleep onset time was 45.4 minutes and the sleep efficiency was 82.4%%. The total sleep time was 306.4 minutes.  Stage REM latency was 56.0 minutes.  The patient spent 0.8%% of the night in stage N1 sleep, 87.6%% in stage N2 sleep, 0.0%% in stage N3 and 11.6% in REM.  Alpha intrusion was absent.  Supine sleep was 100.00%.  RESPIRATORY PARAMETERS The overall apnea/hypopnea index (AHI) was 4.7 per hour. The respiratory disturbance index (RDI) was 4.7/h. There were 0 total apneas, including 0 obstructive, 0 central and 0 mixed apneas. There were 24 hypopneas and 0 RERAs.  The AHI during  Stage REM sleep was 28.7 per hour.  AHI while supine was 4.7 per hour.  The mean oxygen saturation was 93.4%. The minimum SpO2 during sleep was 85.0% during REM sleep.  Soft snoring was noted during this study.  CARDIAC DATA The 2 lead EKG demonstrated sinus rhythm. The mean heart rate was 58.6 beats per minute. Other EKG findings include: None.  LEG MOVEMENT DATA The total PLMS were 0 with a resulting PLMS index of 0.0. Associated arousal with leg movement index was 2.2 .  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 - No significant central sleep apnea occurred during this study (CAI = 0.0/h). - Mild oxygen desaturation to a nadir of 88% with NREM and 85% during REM sleep. - The patient snored with soft snoring volume. - No cardiac abnormalities were noted during this study. - Clinically significant periodic limb movements did not occur during sleep. No significant associated arousals.  DIAGNOSIS - Sleep Apnea, unspecified G47.30 - UARS - Nocturnal Hypoxemia (G47.36)  RECOMMENDATIONS - At present, patient does not meet overall criteria for CPAP, but has moderate sleep apnea during REM sleep. - Effort should be made to optimize nasal and oropharyngeal patency.  - Consider alternatives for snoring and possible evaluation for a customized oral appliance. - If progressive symptoms, recommend a future follow-up evaluation. - Avoid alcohol, sedatives and other CNS depressants that may worsen sleep apnea and disrupt normal sleep architecture. - Sleep hygiene should be reviewed to assess factors that may improve sleep quality. - Weight management (BMI 48) and regular exercise should be initiated or continued if appropriate.  [Electronically signed] 03/24/2020 12:19 PM  Nicki Guadalajara MD, [facility], ABSM Diplomate, American Board of Sleep Medicine   NPI: [id number] [facility] PH: [phone]   FX: [phone] ACCREDITED BY THE AMERICAN ACADEMY OF SLEEP MEDICINE

## 2020-03-27 ENCOUNTER — Other Ambulatory Visit: Payer: Self-pay | Admitting: *Deleted

## 2020-03-27 MED ORDER — ROSUVASTATIN CALCIUM 10 MG PO TABS: 10.0000 mg | ORAL_TABLET | Freq: Every day | ORAL | 3 refills | Status: DC

## 2020-03-28 ENCOUNTER — Other Ambulatory Visit: Payer: Self-pay

## 2020-03-28 ENCOUNTER — Encounter (HOSPITAL_COMMUNITY): Payer: Self-pay | Admitting: Physician Assistant

## 2020-03-28 ENCOUNTER — Ambulatory Visit (HOSPITAL_COMMUNITY)
Admission: RE | Admit: 2020-03-28 | Discharge: 2020-03-28 | Disposition: A | Discharge status: Home / Self Care | Payer: BC Managed Care – PPO | Source: Ambulatory Visit | Attending: Physician Assistant | Admitting: Physician Assistant

## 2020-03-28 VITALS — BP 122/82 | HR 98 | Ht 63.0 in | Wt 276.4 lb

## 2020-03-28 DIAGNOSIS — Z79899 Other long term (current) drug therapy: Secondary | ICD-10-CM | POA: Diagnosis not present

## 2020-03-28 DIAGNOSIS — I10 Essential (primary) hypertension: Secondary | ICD-10-CM | POA: Diagnosis not present

## 2020-03-28 DIAGNOSIS — I4819 Other persistent atrial fibrillation: Secondary | ICD-10-CM | POA: Insufficient documentation

## 2020-03-28 DIAGNOSIS — Z7901 Long term (current) use of anticoagulants: Secondary | ICD-10-CM | POA: Diagnosis not present

## 2020-03-28 DIAGNOSIS — D6869 Other thrombophilia: Secondary | ICD-10-CM | POA: Insufficient documentation

## 2020-03-28 DIAGNOSIS — Z6841 Body Mass Index (BMI) 40.0 and over, adult: Secondary | ICD-10-CM | POA: Insufficient documentation

## 2020-03-28 DIAGNOSIS — I2510 Atherosclerotic heart disease of native coronary artery without angina pectoris: Secondary | ICD-10-CM | POA: Diagnosis not present

## 2020-03-28 DIAGNOSIS — E669 Obesity, unspecified: Secondary | ICD-10-CM | POA: Insufficient documentation

## 2020-03-28 DIAGNOSIS — I4892 Unspecified atrial flutter: Secondary | ICD-10-CM | POA: Diagnosis not present

## 2020-03-28 MED ORDER — METOPROLOL SUCCINATE ER 100 MG PO TB24: 100.0000 mg | ORAL_TABLET | Freq: Two times a day (BID) | ORAL | Status: DC

## 2020-03-28 NOTE — Progress Notes (Addendum)
Primary Care Physician: Mila Palmer, MD Primary Cardiologist: Dr Bjorn Pippin Primary Electrophysiologist: none Referring Physician: Dr Donnamarie Poag Giebler is a 70 y.o. female with a history of hypertension, hyperlipidemia, peptic ulcer disease, and atrial fibrillation who presents for follow up in the [facility]. The patient was initially diagnosed with atrial fibrillation 01/07/20 after presenting with symptoms of increased fatigue. Patient is on Xarelto for a CHADS2VASC score of 4. She underwent DCCV on 02/01/20. Unfortunately, she felt she was back out of rhythm 02/04/20. She was under a considerable amount of stress, both at home and at work, which she feels contributed to her afib recurrence. She does feel that her symptoms of fatigue did improved while in SR. She has a sleep study upcoming. Patient is s/p DCCV on 03/09/20 after starting flecainide.   On follow up today, patient is back in atypical atrial flutter today. She states that she actually felt worse in SR with severe fatigue. She had a cardiac CT which showed an elevated calcium score, 81st percentile for matched controls. She denies any bleeding issues on anticoagulation.   Today, she denies symptoms of palpitations, chest pain, shortness of breath, orthopnea, PND, lower extremity edema, dizziness, presyncope, syncope, bleeding, or neurologic sequela. The patient is tolerating medications without difficulties and is otherwise without complaint today.    Atrial Fibrillation Risk Factors:  she does not have symptoms or diagnosis of sleep apnea. Did not qualify for CPAP on sleep study. she does not have a history of rheumatic fever. The patient does not have a history of early familial atrial fibrillation or other arrhythmias.  she has a BMI of Body mass index is 48.96 kg/m.Marland Kitchen Filed Weights   03/28/20 1531  Weight: 125.4 kg    No family history on file.   Atrial Fibrillation Management  history:  Previous antiarrhythmic drugs: flecainide  Previous cardioversions: 02/01/20, 03/09/20 Previous ablations: none CHADS2VASC score: 4 Anticoagulation history: Xarelto   Past Medical History:  Diagnosis Date  . Atrophic vaginitis   . Chronic edema    no DVT  . GERD (gastroesophageal reflux disease)   . Hyperlipidemia   . Hypertension    echo - 12/22/2006- Normal EF 60% mild left atrial enlargement ,difficult windows   . PUD (peptic ulcer disease)    antral ulcer on EGD 2008- Dr Loreta Ave  . Sessile colonic polyp    personal hx  . Unspecified essential hypertension    nuclear stress-12/22/06 - low risk   . Urge and stress incontinence   . Vitamin D deficiency    Past Surgical History:  Procedure Laterality Date  . CARDIOVERSION N/A 02/01/2020   Procedure: CARDIOVERSION;  Surgeon: Little Ishikawa, MD;  Location: [facility] ENDOSCOPY;  Service: Cardiovascular;  Laterality: N/A;  . CARDIOVERSION N/A 03/09/2020   Procedure: CARDIOVERSION;  Surgeon: Lars Masson, MD;  Location: [facility] ENDOSCOPY;  Service: Cardiovascular;  Laterality: N/A;  . TEE WITHOUT CARDIOVERSION N/A 02/01/2020   Procedure: TRANSESOPHAGEAL ECHOCARDIOGRAM (TEE);  Surgeon: Little Ishikawa, MD;  Location: [facility] ENDOSCOPY;  Service: Cardiovascular;  Laterality: N/A;    Current Outpatient Medications  Medication Sig Dispense Refill  . BD PEN NEEDLE NANO 2ND GEN 32G X 4 MM MISC USE WITH SAXENDA EVERY DAY    . Cholecalciferol (VITAMIN D-3) 125 MCG (5000 UT) TABS Take 5,000 Units by mouth daily at 12 noon.    . docusate sodium (COLACE) 100 MG capsule Take 100 mg by mouth daily.    Marland Kitchen  flecainide (TAMBOCOR) 50 MG tablet Take 2 tablets (100 mg total) by mouth 2 (two) times daily. 60 tablet 3  . metoprolol succinate (TOPROL-XL) 50 MG 24 hr tablet Take 1.5 tablets (75 mg total) by mouth in the morning and at bedtime. 120 tablet 1  . rivaroxaban (XARELTO) 20 MG TABS tablet Take 1 tablet (20 mg total) by mouth daily with  supper. 90 tablet 3  . rosuvastatin (CRESTOR) 10 MG tablet Take 1 tablet (10 mg total) by mouth daily. 90 tablet 3  . tolterodine (DETROL LA) 4 MG 24 hr capsule Take 4 mg by mouth daily.    . vitamin E 180 MG (400 UNITS) capsule Take 400 Units by mouth daily.     No current facility-administered medications for this encounter.    Allergies  Allergen Reactions  . Neosporin [Neomycin-Bacitracin Zn-Polymyx]     ( triple) WBC inflamation: allergy    Social History   Socioeconomic History  . Marital status: Married    Spouse name: Not on file  . Number of children: Not on file  . Years of education: Not on file  . Highest education level: Not on file  Occupational History  . Not on file  Tobacco Use  . Smoking status: Never Smoker  . Smokeless tobacco: Never Used  Substance and Sexual Activity  . Alcohol use: Yes    Alcohol/week: 1.0 standard drink    Types: 1 Standard drinks or equivalent per week  . Drug use: Never  . Sexual activity: Yes    Partners: Male  Other Topics Concern  . Not on file  Social History Narrative   History of smoking: Never smoked   No smoking   No Alcohol   Caffeine : yes soda ,diet coke -occas.   No recreational drug use.   No exercise   Occupation : Adult nurse   Married , Yes  daughter recent grad [employer]   Children 1 daughter age 75 and 1 son age 11   Sexually active : currently sexually active   No HRT   GYN History..   Last pap smear date 2009   Last mammogram date 10/.21/2009   Abnormal pap smear none.                  Social Determinants of Health   Financial Resource Strain: Not on file  Food Insecurity: Not on file  Transportation Needs: Not on file  Physical Activity: Not on file  Stress: Not on file  Social Connections: Not on file  Intimate Partner Violence: Not on file     ROS- All systems are reviewed and negative except as per the HPI above.  Physical Exam: Vitals:   03/28/20 1531  BP: 122/82   Pulse: 98  Weight: 125.4 kg  Height: 5\' 3"  (1.6 m)    GEN- The patient is well appearing obese female, alert and oriented x 3 today.   HEENT-head normocephalic, atraumatic, sclera clear, conjunctiva pink, hearing intact, trachea midline. Lungs- Clear to ausculation bilaterally, normal work of breathing Heart- irregular rate and rhythm, no murmurs, rubs or gallops  GI- soft, NT, ND, + BS Extremities- no clubbing, cyanosis, or edema MS- no significant deformity or atrophy Skin- no rash or lesion Psych- euthymic mood, full affect Neuro- strength and sensation are intact   Wt Readings from Last 3 Encounters:  03/28/20 125.4 kg  03/16/20 126.3 kg  03/10/20 122.5 kg    EKG today demonstrates  Atypical atrial flutter with  variable block Vent. rate 98 BPM QRS duration 102 ms QT/QTc 342/436 ms  Echo 02/07/20 demonstrated  1. Left ventricular ejection fraction, by estimation, is 55 to 60%. The  left ventricle has normal function. The left ventricle has no regional  wall motion abnormalities. There is mild concentric left ventricular  hypertrophy. Left ventricular diastolic  parameters are indeterminate.  2. Right ventricular systolic function is normal. The right ventricular  size is normal. There is normal pulmonary artery systolic pressure.  3. Planimetry has underestimated the atrial size: manual measurement is would yield a index volume of 45 cc/BSA. Left atrial size was mild to  moderately dilated.  4. Planimetry has underestimated the atrial size: manual measurement is  would yield a index volume of 23 cc/BSA.  5. The mitral valve is grossly normal. Trivial mitral valve  regurgitation.  6. The aortic valve is tricuspid. Aortic valve regurgitation is not  visualized. No aortic stenosis is present.  7. The inferior vena cava is normal in size with greater than 50%  respiratory variability, suggesting right atrial pressure of 3 mmHg.   Epic records are reviewed at  length today  CHA2DS2-VASc Score = 4  The patient's score is based upon: CHF History: No HTN History: Yes Diabetes History: No Stroke History: No Vascular Disease History: Yes Age Score: 1 Gender Score: 1      ASSESSMENT AND PLAN: 1. Persistent Atrial Fibrillation/atrial flutter The patient's CHA2DS2-VASc score is 4, indicating a 4.8% annual risk of stroke.   S/p DCCV on 03/09/20 with early return of afib. We discussed therapeutic options today including alternate AAD (dofetilide, amiodarone) vs rate control. Given her paucity of symptoms, she would like to pursue rate control at this point. Stop flecainide with failure to maintain SR and elevated calcium score on CTA Continue Toprol 100 mg BID Continue Xarelto 20 mg daily  2. Secondary Hypercoagulable State (ICD10:  D68.69) The patient is at significant risk for stroke/thromboembolism based upon her CHA2DS2-VASc Score of 4.  Continue Rivaroxaban (Xarelto).   3. Obesity Body mass index is 48.96 kg/m. Lifestyle modification was discussed and encouraged including regular physical activity and weight reduction.  4. Sleep disordered breathing  Sleep study did not show significant OSA, CPAP not recommended. Could consider oral device.  5. HTN Stable, no changes today.  6. CAD Elevated calcium score 173, 81st percentile.  No anginal symptoms. Now on statin.   Follow up with Dr Bjorn Pippin as scheduled.    Jorja Loa PA-C [facility] [address] [place], [place] [address] [phone] 03/28/2020 4:03 PM

## 2020-03-28 NOTE — Addendum Note (Signed)
Encounter addended by: Shona Simpson, RN on: 03/28/2020 4:08 PM  Actions taken: Medication long-term status modified, Order list changed

## 2020-03-31 ENCOUNTER — Telehealth: Payer: Self-pay | Admitting: *Deleted

## 2020-03-31 NOTE — Telephone Encounter (Signed)
Patient notified of sleep study results and recommendations. She will call back for a referral if she decides that she wants to use a oral appliance.

## 2020-04-30 NOTE — Progress Notes (Signed)
Cardiology Office Note:    Date:  05/03/2020   ID:  Megan Waters, DOB [birth year], MRN [id number]  PCP:  Mila Palmer, MD  Cardiologist:  No primary care provider on file.  Electrophysiologist:  None   Referring MD: Mila Palmer, MD   Chief Complaint  Patient presents with  . Atrial Fibrillation    History of Present Illness:    Megan Waters is a 70 y.o. female with a hx of recently diagnosed atrial fibrillation, hypertension, hyperlipidemia, peptic ulcer disease who presents for follow-up.  She was referred by Dr. Paulino Rily for evaluation of atrial fibrillation, initially seen on 01/07/2020.  She reports that about 2 months ago but began feeling very fatigued.  Also reported feeling lightheaded.  Rare palpitations.  Denies any chest pain, dyspnea, or syncope.  Recently did yard work, no worsening in symptoms.  Seen by her PCP on 01/06/2020 and found to be in atrial fibrillation with RVR, which was a new diagnosis.  Last echocardiogram 12/22/2006, showed normal EF, mild left atrial enlargement.  History of peptic ulcer disease on EGD in 2008.  No recent bleeding.  Labs on 12/02/2019 showed A1c 5.8, albumin 4.0, TSH 3.05, normal LFTs, LDL 148, creatinine 0.74, hemoglobin 15.8, platelets 195.  Underwent successful TEE cardioversion on 02/01/2020.  TEE showed EF 50 to 55%, mildly reduced RV function, no LAA thrombus.  Unfortunately converted back to atrial fibrillation a few days following cardioversion.  TTE 02/07/2020 showed EF 55 to 60%, normal RV function, mild to moderate left atrial dilatation.  Was seen in A. fib clinic on 02/17/2020, started on flecainide and underwent successful DCCV on 03/09/2020.  However at follow-up on 03/28/2020 she was back in A. Fib.  Discussion was had with A. fib clinic about alternative antiarrhythmic (Tikosyn or amiodarone) versus rate control.  Given her lack of symptoms, it was decided to pursue rate control.  Calcium score on 03/20/2021 was 173 (81st  percentile).  Since last clinic visit, she reports that she has been doing okay.  Denies any chest pain, dyspnea, lightheadedness, syncope.  Reports she had a few episodes of waking up at night with heart racing.  She checked her heart rate at one of the episodes and her heart rate was in the 80s.  States that this started after she had started her statin medicine so she stopped taking.  Episodes resolved.  Also reports has been having some lower extremity edema.  She is taking Xarelto, denies any bleeding issues.  Has intermittently checked her heart rate at work and has been up to 120s.   BP Readings from Last 3 Encounters:  05/03/20 134/85  03/28/20 122/82  03/16/20 134/80   Wt Readings from Last 3 Encounters:  05/03/20 276 lb 12.8 oz (125.6 kg)  03/28/20 276 lb 6.4 oz (125.4 kg)  03/16/20 278 lb 6.4 oz (126.3 kg)    Past Medical History:  Diagnosis Date  . Atrophic vaginitis   . Chronic edema    no DVT  . GERD (gastroesophageal reflux disease)   . Hyperlipidemia   . Hypertension    echo - 12/22/2006- Normal EF 60% mild left atrial enlargement ,difficult windows   . PUD (peptic ulcer disease)    antral ulcer on EGD 2008- Dr Loreta Ave  . Sessile colonic polyp    personal hx  . Unspecified essential hypertension    nuclear stress-12/22/06 - low risk   . Urge and stress incontinence   . Vitamin D deficiency  Current Medications: Current Meds  Medication Sig  . Cholecalciferol (VITAMIN D-3) 125 MCG (5000 UT) TABS Take 5,000 Units by mouth daily at 12 noon.  . diltiazem (CARDIZEM CD) 120 MG 24 hr capsule Take 1 capsule (120 mg total) by mouth daily.  Marland Kitchen docusate sodium (COLACE) 100 MG capsule Take 100 mg by mouth daily.  . rivaroxaban (XARELTO) 20 MG TABS tablet Take 1 tablet (20 mg total) by mouth daily with supper.  . rosuvastatin (CRESTOR) 10 MG tablet Take 1 tablet (10 mg total) by mouth daily.  Marland Kitchen tolterodine (DETROL LA) 4 MG 24 hr capsule Take 4 mg by mouth daily.  .  vitamin E 180 MG (400 UNITS) capsule Take 400 Units by mouth daily.  . [DISCONTINUED] BD PEN NEEDLE NANO 2ND GEN 32G X 4 MM MISC USE WITH SAXENDA EVERY DAY  . [DISCONTINUED] metoprolol succinate (TOPROL-XL) 100 MG 24 hr tablet Take 1 tablet (100 mg total) by mouth in the morning and at bedtime.  . [DISCONTINUED] rosuvastatin (CRESTOR) 10 MG tablet Take 1 tablet (10 mg total) by mouth daily.     Allergies:   Neosporin [neomycin-bacitracin zn-polymyx]   Social History   Socioeconomic History  . Marital status: Married    Spouse name: Not on file  . Number of children: Not on file  . Years of education: Not on file  . Highest education level: Not on file  Occupational History  . Not on file  Tobacco Use  . Smoking status: Never Smoker  . Smokeless tobacco: Never Used  Substance and Sexual Activity  . Alcohol use: Yes    Alcohol/week: 1.0 standard drink    Types: 1 Standard drinks or equivalent per week  . Drug use: Never  . Sexual activity: Yes    Partners: Male  Other Topics Concern  . Not on file  Social History Narrative   History of smoking: Never smoked   No smoking   No Alcohol   Caffeine : yes soda ,diet coke -occas.   No recreational drug use.   No exercise   Occupation : Adult nurse   Married , Yes  daughter recent grad [employer]   Children 1 daughter age 25 and 1 son age 44   Sexually active : currently sexually active   No HRT   GYN History..   Last pap smear date 2009   Last mammogram date 10/.21/2009   Abnormal pap smear none.                  Social Determinants of Health   Financial Resource Strain: Not on file  Food Insecurity: Not on file  Transportation Needs: Not on file  Physical Activity: Not on file  Stress: Not on file  Social Connections: Not on file     Family History: The patient's family history is not on file.  ROS:   Please see the history of present illness.     All other systems reviewed and are  negative.  EKGs/Labs/Other Studies Reviewed:    The following studies were reviewed today:   EKG:  EKG is ordered today.  The ekg ordered today demonstrates A. fib with RVR, rate 110, nonspecific T wave flattening  Recent Labs: 03/06/2020: BUN 21; Creatinine, Ser 0.90; Hemoglobin 15.9; Platelets 226; Potassium 4.7; Sodium 141  Recent Lipid Panel No results found for: CHOL, TRIG, HDL, CHOLHDL, VLDL, LDLCALC, LDLDIRECT  Physical Exam:    VS:  BP 134/85   Pulse (!) 110  Ht  (1.6 m)   Wt 276 lb 12.8 oz (125.6 kg)   SpO2 98%   BMI 49.03 kg/m   BP 110/68  Wt Readings from Last 3 Encounters:  05/03/20 276 lb 12.8 oz (125.6 kg)  03/28/20 276 lb 6.4 oz (125.4 kg)  03/16/20 278 lb 6.4 oz (126.3 kg)     GEN:  in no acute distress HEENT: Normal NECK: No JVD; No carotid bruits CARDIAC: Tachycardic, irregular, no murmurs RESPIRATORY:  Clear to auscultation without rales, wheezing or rhonchi  ABDOMEN: Soft, non-tender, non-distended MUSCULOSKELETAL: Trace edema; No deformity  SKIN: Warm and dry NEUROLOGIC:  Alert and oriented x 3 PSYCHIATRIC:  Normal affect   ASSESSMENT:    1. Persistent atrial fibrillation (HCC)   2. Coronary artery disease involving native coronary artery of native heart without angina pectoris   3. Essential hypertension   4. Hyperlipidemia, unspecified hyperlipidemia type    PLAN:    Atrial fibrillation: recent diagnosis, rates 140s at initial clinic visit 01/07/20.  CHA2DS2-VASc score 3 (hypertension, age, female), she was started on Xarelto 01/06/20.  Underwent successful TEE cardioversion on 02/01/2020.  TEE showed EF 50 to 55%, mildly reduced RV function, no LAA thrombus.  Unfortunately converted back to atrial fibrillation a few days following cardioversion.  TTE 02/07/2020 showed EF 55 to 60%, normal RV function, mild to moderate left atrial dilatation.  Was seen in A. fib clinic on 02/17/2020, started on flecainide and underwent successful DCCV on  03/09/2020.  However at follow-up on 03/28/2020 she was back in A. Fib.  Discussion was had with A. fib clinic about alternative antiarrhythmic (Tikosyn or amiodarone) versus rate control.  Initially decided to pursue rate control -Rates elevated in clinic today and she reports has been elevated to 120s when she checks at home.  Continue Toprol-XL 100 mg twice daily, will add diltiazem 120 mg daily.  Check Zio patch x3 days to evaluate for rate control -Continue Xarelto 20 mg daily -She initially wanted to pursue a rate control strategy, but as she has been difficult to rate control and does report recent symptoms of palpitations and mild lower extremity edema, she is now interested in rhythm control.  Will refer back to A. fib clinic to discuss antiarrhythmic options  CAD: Calcium score on 03/20/2021 was 173 (81st percentile).  No anginal symptoms.  LDL 120, started on rosuvastatin 10 mg daily, but she stopped taking as reported palpitations when she started that.  Recommend restarting statin, if symptoms recur will switch to alternative statin  Hypertension: On Toprol-XL 100 mg twice daily.  Adding diltiazem for rate control of AF as above  Hyperlipidemia: Reports recent labs showed LDL 120.  Started rosuvastatin 10 mg daily  Snoring: Sleep study 03/10/2020 showed moderate OSA during REM sleep but overall did not meet criteria for CPAP, recommended considering oral appliance.  RTC in 3 months   Medication Adjustments/Labs and Tests Ordered: Current medicines are reviewed at length with the patient today.  Concerns regarding medicines are outlined above.  Orders Placed This Encounter  Procedures  . LONG TERM MONITOR (3-14 DAYS)  . EKG 12-Lead   Meds ordered this encounter  Medications  . diltiazem (CARDIZEM CD) 120 MG 24 hr capsule    Sig: Take 1 capsule (120 mg total) by mouth daily.    Dispense:  90 capsule    Refill:  3  . rosuvastatin (CRESTOR) 10 MG tablet    Sig: Take 1 tablet (10 mg  total) by mouth  daily.    Dispense:  90 tablet    Refill:  3  . metoprolol succinate (TOPROL-XL) 100 MG 24 hr tablet    Sig: Take 1 tablet (100 mg total) by mouth in the morning and at bedtime.    Dispense:  180 tablet    Refill:  3    Dose increase please fill    Patient Instructions  Medication Instructions:  START diltiazem (Cardizem) 120 mg once daily Restart rosuvastatin (Crestor) 10 mg once daily  *If you need a refill on your cardiac medications before your next appointment, please call your pharmacy*  Testing/Procedures:  ZIO XT- Long Term Monitor Instructions   Your physician has requested you wear your ZIO patch monitor___3___days.   This is a single patch monitor.  [employer] supplies one patch monitor per enrollment.  Additional stickers are not available.   Please do not apply patch if you will be having a Nuclear Stress Test, Echocardiogram, Cardiac CT, MRI, or Chest Xray during the time frame you would be wearing the monitor. The patch cannot be worn during these tests.  You cannot remove and re-apply the ZIO XT patch monitor.   Your ZIO patch monitor will be sent USPS Priority mail from [facility] directly to your home address. The monitor may also be mailed to a PO BOX if home delivery is not available.   It may take 3-5 days to receive your monitor after you have been enrolled.   Once you have received you monitor, please review enclosed instructions.  Your monitor has already been registered assigning a specific monitor serial # to you.   Applying the monitor   Shave hair from upper left chest.   Hold abrader disc by orange tab.  Rub abrader in 40 strokes over left upper chest as indicated in your monitor instructions.   Clean area with 4 enclosed alcohol pads .  Use all pads to assure are is cleaned thoroughly.  Let dry.   Apply patch as indicated in monitor instructions.  Patch will be place under collarbone on left side of chest with arrow pointing  upward.   Rub patch adhesive wings for 2 minutes.Remove white label marked "1".  Remove white label marked "2".  Rub patch adhesive wings for 2 additional minutes.   While looking in a mirror, press and release button in center of patch.  A small green light will flash 3-4 times .  This will be your only indicator the monitor has been turned on.     Do not shower for the first 24 hours.  You may shower after the first 24 hours.   Press button if you feel a symptom. You will hear a small click.  Record Date, Time and Symptom in the Patient Log Book.   When you are ready to remove patch, follow instructions on last 2 pages of Patient Log Book.  Stick patch monitor onto last page of Patient Log Book.   Place Patient Log Book in [facility] box.  Use locking tab on box and tape box closed securely.  The [employer] and [employer] has [employer] on it.  Please place in mailbox as soon as possible.  Your physician should have your test results approximately 7 days after the monitor has been mailed back to [facility].   Call [facility] Customer Care at [phone] if you have questions regarding your ZIO XT patch monitor.  Call them immediately if you see an orange light blinking on your monitor.  If your monitor falls off in less than 4 days contact our Monitor department at [phone].  If your monitor becomes loose or falls off after 4 days call [employer] at [phone] for suggestions on securing your monitor.    Follow-Up: At [facility], you and your health needs are our priority.  As part of our continuing mission to provide you with exceptional heart care, we have created designated Provider Care Teams.  These Care Teams include your primary Cardiologist (physician) and Advanced Practice Providers (APPs -  Physician Assistants and Nurse Practitioners) who all work together to provide you with the care you need, when you need it.  We recommend signing up for the patient portal called  "MyChart".  Sign up information is provided on this After Visit Summary.  MyChart is used to connect with patients for Virtual Visits (Telemedicine).  Patients are able to view lab/test results, encounter notes, upcoming appointments, etc.  Non-urgent messages can be sent to your provider as well.   To learn more about what you can do with MyChart, go to [email or web address].    Your next appointment:   3 month(s)  The format for your next appointment:   In Person  Provider:   Epifanio Lescheshristopher Tine Mabee, MD   Other Instructions Schedule follow up with Atrial Fibrillation Clinic     Signed, Little Ishikawahristopher L Bernice Mcauliffe, MD  05/03/2020 9:25 AM    [facility]

## 2020-05-03 ENCOUNTER — Encounter: Payer: Self-pay | Admitting: *Deleted

## 2020-05-03 ENCOUNTER — Ambulatory Visit (INDEPENDENT_AMBULATORY_CARE_PROVIDER_SITE_OTHER): Payer: BC Managed Care – PPO | Admitting: Cardiology

## 2020-05-03 ENCOUNTER — Other Ambulatory Visit: Payer: Self-pay

## 2020-05-03 ENCOUNTER — Encounter: Payer: Self-pay | Admitting: Cardiology

## 2020-05-03 ENCOUNTER — Ambulatory Visit (INDEPENDENT_AMBULATORY_CARE_PROVIDER_SITE_OTHER): Payer: BC Managed Care – PPO

## 2020-05-03 VITALS — BP 134/85 | HR 110 | Ht 63.0 in | Wt 276.8 lb

## 2020-05-03 DIAGNOSIS — I10 Essential (primary) hypertension: Secondary | ICD-10-CM

## 2020-05-03 DIAGNOSIS — E785 Hyperlipidemia, unspecified: Secondary | ICD-10-CM

## 2020-05-03 DIAGNOSIS — I4819 Other persistent atrial fibrillation: Secondary | ICD-10-CM

## 2020-05-03 DIAGNOSIS — I2510 Atherosclerotic heart disease of native coronary artery without angina pectoris: Secondary | ICD-10-CM | POA: Diagnosis not present

## 2020-05-03 MED ORDER — METOPROLOL SUCCINATE ER 100 MG PO TB24: 100.0000 mg | ORAL_TABLET | Freq: Two times a day (BID) | ORAL | 3 refills | Status: DC

## 2020-05-03 MED ORDER — ROSUVASTATIN CALCIUM 10 MG PO TABS: 10.0000 mg | ORAL_TABLET | Freq: Every day | ORAL | 3 refills | Status: DC

## 2020-05-03 MED ORDER — DILTIAZEM HCL ER COATED BEADS 120 MG PO CP24: 120.0000 mg | ORAL_CAPSULE | Freq: Every day | ORAL | 3 refills | Status: DC

## 2020-05-03 NOTE — Patient Instructions (Signed)
Medication Instructions:  START diltiazem (Cardizem) 120 mg once daily Restart rosuvastatin (Crestor) 10 mg once daily  *If you need a refill on your cardiac medications before your next appointment, please call your pharmacy*  Testing/Procedures:  ZIO XT- Long Term Monitor Instructions   Your physician has requested you wear your ZIO patch monitor___3___days.   This is a single patch monitor.  [employer] supplies one patch monitor per enrollment.  Additional stickers are not available.   Please do not apply patch if you will be having a Nuclear Stress Test, Echocardiogram, Cardiac CT, MRI, or Chest Xray during the time frame you would be wearing the monitor. The patch cannot be worn during these tests.  You cannot remove and re-apply the ZIO XT patch monitor.   Your ZIO patch monitor will be sent USPS Priority mail from [facility] directly to your home address. The monitor may also be mailed to a PO BOX if home delivery is not available.   It may take 3-5 days to receive your monitor after you have been enrolled.   Once you have received you monitor, please review enclosed instructions.  Your monitor has already been registered assigning a specific monitor serial # to you.   Applying the monitor   Shave hair from upper left chest.   Hold abrader disc by orange tab.  Rub abrader in 40 strokes over left upper chest as indicated in your monitor instructions.   Clean area with 4 enclosed alcohol pads .  Use all pads to assure are is cleaned thoroughly.  Let dry.   Apply patch as indicated in monitor instructions.  Patch will be place under collarbone on left side of chest with arrow pointing upward.   Rub patch adhesive wings for 2 minutes.Remove white label marked "1".  Remove white label marked "2".  Rub patch adhesive wings for 2 additional minutes.   While looking in a mirror, press and release button in center of patch.  A small green light will flash 3-4 times .  This will  be your only indicator the monitor has been turned on.     Do not shower for the first 24 hours.  You may shower after the first 24 hours.   Press button if you feel a symptom. You will hear a small click.  Record Date, Time and Symptom in the Patient Log Book.   When you are ready to remove patch, follow instructions on last 2 pages of Patient Log Book.  Stick patch monitor onto last page of Patient Log Book.   Place Patient Log Book in [place] box.  Use locking tab on box and tape box closed securely.  The [employer] and [employer] has [employer] on it.  Please place in mailbox as soon as possible.  Your physician should have your test results approximately 7 days after the monitor has been mailed back to [facility].   Call [facility] Customer Care at [phone] if you have questions regarding your ZIO XT patch monitor.  Call them immediately if you see an orange light blinking on your monitor.   If your monitor falls off in less than 4 days contact our Monitor department at [phone].  If your monitor becomes loose or falls off after 4 days call [employer] at [phone] for suggestions on securing your monitor.    Follow-Up: At [facility], you and your health needs are our priority.  As part of our continuing mission to provide you with exceptional heart care,  we have created designated Provider Care Teams.  These Care Teams include your primary Cardiologist (physician) and Advanced Practice Providers (APPs -  Physician Assistants and Nurse Practitioners) who all work together to provide you with the care you need, when you need it.  We recommend signing up for the patient portal called "MyChart".  Sign up information is provided on this After Visit Summary.  MyChart is used to connect with patients for Virtual Visits (Telemedicine).  Patients are able to view lab/test results, encounter notes, upcoming appointments, etc.  Non-urgent messages can be sent to your provider as well.    To learn more about what you can do with MyChart, go to [email or web address].    Your next appointment:   3 month(s)  The format for your next appointment:   In Person  Provider:   Epifanio Lesches, MD   Other Instructions Schedule follow up with Atrial Fibrillation Clinic

## 2020-05-03 NOTE — Progress Notes (Signed)
Patient ID: Megan Waters, female   DOB: 06-26-1950, 70 y.o.   MRN: [id number] Patient enrolled for [employer] to ship a 1 day ZIO XT monitor to her home.

## 2020-05-05 DIAGNOSIS — Z23 Encounter for immunization: Secondary | ICD-10-CM | POA: Diagnosis not present

## 2020-05-08 DIAGNOSIS — I4819 Other persistent atrial fibrillation: Secondary | ICD-10-CM | POA: Diagnosis not present

## 2020-05-10 DIAGNOSIS — Z6841 Body Mass Index (BMI) 40.0 and over, adult: Secondary | ICD-10-CM | POA: Diagnosis not present

## 2020-05-10 DIAGNOSIS — I4891 Unspecified atrial fibrillation: Secondary | ICD-10-CM | POA: Diagnosis not present

## 2020-05-17 ENCOUNTER — Telehealth: Payer: Self-pay | Admitting: Pharmacist

## 2020-05-17 ENCOUNTER — Encounter (HOSPITAL_COMMUNITY): Payer: Self-pay | Admitting: Physician Assistant

## 2020-05-17 ENCOUNTER — Ambulatory Visit (HOSPITAL_COMMUNITY)
Admission: RE | Admit: 2020-05-17 | Discharge: 2020-05-17 | Disposition: A | Discharge status: Home / Self Care | Payer: BC Managed Care – PPO | Source: Ambulatory Visit | Attending: Physician Assistant | Admitting: Physician Assistant

## 2020-05-17 ENCOUNTER — Other Ambulatory Visit: Payer: Self-pay

## 2020-05-17 VITALS — BP 120/86 | HR 117 | Ht 63.0 in | Wt 279.8 lb

## 2020-05-17 DIAGNOSIS — I10 Essential (primary) hypertension: Secondary | ICD-10-CM | POA: Insufficient documentation

## 2020-05-17 DIAGNOSIS — D6869 Other thrombophilia: Secondary | ICD-10-CM | POA: Insufficient documentation

## 2020-05-17 DIAGNOSIS — E669 Obesity, unspecified: Secondary | ICD-10-CM | POA: Diagnosis not present

## 2020-05-17 DIAGNOSIS — I2510 Atherosclerotic heart disease of native coronary artery without angina pectoris: Secondary | ICD-10-CM | POA: Diagnosis not present

## 2020-05-17 DIAGNOSIS — I4819 Other persistent atrial fibrillation: Secondary | ICD-10-CM | POA: Diagnosis not present

## 2020-05-17 DIAGNOSIS — I4892 Unspecified atrial flutter: Secondary | ICD-10-CM | POA: Insufficient documentation

## 2020-05-17 DIAGNOSIS — Z6841 Body Mass Index (BMI) 40.0 and over, adult: Secondary | ICD-10-CM | POA: Diagnosis not present

## 2020-05-17 DIAGNOSIS — Z7901 Long term (current) use of anticoagulants: Secondary | ICD-10-CM | POA: Diagnosis not present

## 2020-05-17 DIAGNOSIS — Z79899 Other long term (current) drug therapy: Secondary | ICD-10-CM | POA: Diagnosis not present

## 2020-05-17 LAB — MAGNESIUM: Magnesium: 2.2 mg/dL (ref 1.7–2.4)

## 2020-05-17 NOTE — Progress Notes (Signed)
Primary Care Physician: Mila Palmer, MD Primary Cardiologist: Dr Bjorn Pippin Primary Electrophysiologist: none Referring Physician: Dr Donnamarie Poag Megan Waters is a 70 y.o. female with a history of hypertension, hyperlipidemia, peptic ulcer disease, and atrial fibrillation who presents for follow up in the [facility]. The patient was initially diagnosed with atrial fibrillation 01/07/20 after presenting with symptoms of increased fatigue. Patient is on Xarelto for a CHADS2VASC score of 4. She underwent DCCV on 02/01/20. Unfortunately, she felt she was back out of rhythm 02/04/20. She was under a considerable amount of stress, both at home and at work, which she feels contributed to her afib recurrence. Patient is s/p DCCV on 03/09/20 after starting flecainide however she was back in atypical atrial flutter on follow up. She states that she actually felt worse in SR with severe fatigue. The decision was made at that point for rate control.  On follow up today, patient reports she has been more fatigued recently. Zio patch showed avg HR 99 bpm but higher during waking hours. She was started on diltiazem but has been difficult to rate control and so she is referred back for rhythm control. She denies any bleeding issues on anticoagulation.   Today, she denies symptoms of palpitations, chest pain, shortness of breath, orthopnea, PND, lower extremity edema, dizziness, presyncope, syncope, bleeding, or neurologic sequela. The patient is tolerating medications without difficulties and is otherwise without complaint today.    Atrial Fibrillation Risk Factors:  she does not have symptoms or diagnosis of sleep apnea. Did not qualify for CPAP on sleep study. she does not have a history of rheumatic fever. The patient does not have a history of early familial atrial fibrillation or other arrhythmias.  she has a BMI of Body mass index is 49.56 kg/m.Marland Kitchen Filed Weights    05/17/20 1501  Weight: 126.9 kg    No family history on file.   Atrial Fibrillation Management history:  Previous antiarrhythmic drugs: flecainide  Previous cardioversions: 02/01/20, 03/09/20 Previous ablations: none CHADS2VASC score: 4 Anticoagulation history: Xarelto   Past Medical History:  Diagnosis Date  . Atrophic vaginitis   . Chronic edema    no DVT  . GERD (gastroesophageal reflux disease)   . Hyperlipidemia   . Hypertension    echo - 12/22/2006- Normal EF 60% mild left atrial enlargement ,difficult windows   . PUD (peptic ulcer disease)    antral ulcer on EGD 2008- Dr Loreta Ave  . Sessile colonic polyp    personal hx  . Unspecified essential hypertension    nuclear stress-12/22/06 - low risk   . Urge and stress incontinence   . Vitamin D deficiency    Past Surgical History:  Procedure Laterality Date  . CARDIOVERSION N/A 02/01/2020   Procedure: CARDIOVERSION;  Surgeon: Little Ishikawa, MD;  Location: [facility] ENDOSCOPY;  Service: Cardiovascular;  Laterality: N/A;  . CARDIOVERSION N/A 03/09/2020   Procedure: CARDIOVERSION;  Surgeon: Lars Masson, MD;  Location: [facility] ENDOSCOPY;  Service: Cardiovascular;  Laterality: N/A;  . TEE WITHOUT CARDIOVERSION N/A 02/01/2020   Procedure: TRANSESOPHAGEAL ECHOCARDIOGRAM (TEE);  Surgeon: Little Ishikawa, MD;  Location: [facility] ENDOSCOPY;  Service: Cardiovascular;  Laterality: N/A;    Current Outpatient Medications  Medication Sig Dispense Refill  . Cholecalciferol (VITAMIN D-3) 125 MCG (5000 UT) TABS Take 5,000 Units by mouth daily at 12 noon.    . diltiazem (CARDIZEM CD) 120 MG 24 hr capsule Take 1 capsule (120 mg total) by mouth daily. 90  capsule 3  . loratadine (CLARITIN) 10 MG tablet Take 10 mg by mouth daily.    . metoprolol succinate (TOPROL-XL) 100 MG 24 hr tablet Take 1 tablet (100 mg total) by mouth in the morning and at bedtime. 180 tablet 3  . rivaroxaban (XARELTO) 20 MG TABS tablet Take 1 tablet (20 mg total) by  mouth daily with supper. 90 tablet 3  . rosuvastatin (CRESTOR) 10 MG tablet Take 1 tablet (10 mg total) by mouth daily. 90 tablet 3  . tolterodine (DETROL LA) 4 MG 24 hr capsule Take 4 mg by mouth daily.    . vitamin E 180 MG (400 UNITS) capsule Take 400 Units by mouth daily.    Marland Kitchen docusate sodium (COLACE) 100 MG capsule Take 100 mg by mouth daily.     No current facility-administered medications for this encounter.    Allergies  Allergen Reactions  . Neosporin [Neomycin-Bacitracin Zn-Polymyx]     ( triple) WBC inflamation: allergy    Social History   Socioeconomic History  . Marital status: Married    Spouse name: Not on file  . Number of children: Not on file  . Years of education: Not on file  . Highest education level: Not on file  Occupational History  . Not on file  Tobacco Use  . Smoking status: Never Smoker  . Smokeless tobacco: Never Used  Substance and Sexual Activity  . Alcohol use: Yes    Alcohol/week: 1.0 standard drink    Types: 1 Standard drinks or equivalent per week  . Drug use: Never  . Sexual activity: Yes    Partners: Male  Other Topics Concern  . Not on file  Social History Narrative   History of smoking: Never smoked   No smoking   No Alcohol   Caffeine : yes soda ,diet coke -occas.   No recreational drug use.   No exercise   Occupation : Adult nurse   Married , Yes  daughter recent grad [employer]   Children 1 daughter age 53 and 1 son age 24   Sexually active : currently sexually active   No HRT   GYN History..   Last pap smear date 2009   Last mammogram date 10/.21/2009   Abnormal pap smear none.                  Social Determinants of Health   Financial Resource Strain: Not on file  Food Insecurity: Not on file  Transportation Needs: Not on file  Physical Activity: Not on file  Stress: Not on file  Social Connections: Not on file  Intimate Partner Violence: Not on file     ROS- All systems are reviewed and negative  except as per the HPI above.  Physical Exam: Vitals:   05/17/20 1501  BP: 120/86  Pulse: (!) 117  Weight: 126.9 kg  Height: 5\' 3"  (1.6 m)    GEN- The patient is a well appearing obese female, alert and oriented x 3 today.   HEENT-head normocephalic, atraumatic, sclera clear, conjunctiva pink, hearing intact, trachea midline. Lungs- Clear to ausculation bilaterally, normal work of breathing Heart- irregular rate and rhythm, no murmurs, rubs or gallops  GI- soft, NT, ND, + BS Extremities- no clubbing, cyanosis, or edema MS- no significant deformity or atrophy Skin- no rash or lesion Psych- euthymic mood, full affect Neuro- strength and sensation are intact   Wt Readings from Last 3 Encounters:  05/17/20 126.9 kg  05/03/20 125.6 kg  03/28/20 125.4 kg    EKG today demonstrates  Coarse afib vs atypical flutter with variable block Vent. rate 117 BPM PR interval * ms QRS duration 76 ms QT/QTcB 326/454 ms  Echo 02/07/20 demonstrated  1. Left ventricular ejection fraction, by estimation, is 55 to 60%. The  left ventricle has normal function. The left ventricle has no regional  wall motion abnormalities. There is mild concentric left ventricular  hypertrophy. Left ventricular diastolic parameters are indeterminate.  2. Right ventricular systolic function is normal. The right ventricular  size is normal. There is normal pulmonary artery systolic pressure.  3. Planimetry has underestimated the atrial size: manual measurement is would yield a index volume of 45 cc/BSA. Left atrial size was mild to moderately dilated.  4. Planimetry has underestimated the atrial size: manual measurement is would yield a index volume of 23 cc/BSA.  5. The mitral valve is grossly normal. Trivial mitral valve  regurgitation.  6. The aortic valve is tricuspid. Aortic valve regurgitation is not  visualized. No aortic stenosis is present.  7. The inferior vena cava is normal in size with greater  than 50%  respiratory variability, suggesting right atrial pressure of 3 mmHg.   Epic records are reviewed at length today  CHA2DS2-VASc Score = 4  The patient's score is based upon: CHF History: No HTN History: Yes Diabetes History: No Stroke History: No Vascular Disease History: Yes Age Score: 1 Gender Score: 1      ASSESSMENT AND PLAN: 1. Persistent Atrial Fibrillation/atrial flutter The patient's CHA2DS2-VASc score is 4, indicating a 4.8% annual risk of stroke.   S/p DCCV on 03/09/20 with early return of afib. Patient initially opted for rate control but she has continued to have rapid rates. We discussed therapeutic options today including alternate AAD (dofetilide 442 ms, amiodarone)  Will plan for dofetilide admission. Patient to check on price of dofetilide. Continue Xarelto 20 mg daily PharmD to screen medications QTc in SR 442 ms Recent bmet reviewed. Check mag today. Continue Toprol 100 mg BID Continue diltiazem 120 mg daily  2. Secondary Hypercoagulable State (ICD10:  D68.69) The patient is at significant risk for stroke/thromboembolism based upon her CHA2DS2-VASc Score of 4.  Continue Rivaroxaban (Xarelto).   3. Obesity Body mass index is 49.56 kg/m. Lifestyle modification was discussed and encouraged including regular physical activity and weight reduction.  4. Sleep disordered breathing  Sleep study did not show significant OSA, CPAP not recommended.   5. HTN Stable, no changes today.  6. CAD Elevated calcium score 173, 81st percentile.  No anginal symptoms.   Follow up for dofetilide admission on 06/05/20.   Jorja Loa PA-C Afib Clinic [facility] [address] [place], [place] [address] [phone] 05/17/2020 3:07 PM

## 2020-05-17 NOTE — Telephone Encounter (Signed)
Medication list reviewed in anticipation of upcoming Tikosyn initiation. Patient is not taking any contraindicated or QTc prolonging medications.   Patient is anticoagulated on Xarelto 20mg daily on the appropriate dose. Please ensure that patient has not missed any anticoagulation doses in the 3 weeks prior to Tikosyn initiation.   Patient will need to be counseled to avoid use of Benadryl while on Tikosyn and in the 2-3 days prior to Tikosyn initiation.  

## 2020-06-02 ENCOUNTER — Other Ambulatory Visit (HOSPITAL_COMMUNITY)
Admission: RE | Admit: 2020-06-02 | Discharge: 2020-06-02 | Disposition: A | Discharge status: Home / Self Care | Payer: BC Managed Care – PPO | Source: Ambulatory Visit | Attending: Physician Assistant | Admitting: Physician Assistant

## 2020-06-02 DIAGNOSIS — K219 Gastro-esophageal reflux disease without esophagitis: Secondary | ICD-10-CM | POA: Diagnosis not present

## 2020-06-02 DIAGNOSIS — Z86010 Personal history of colonic polyps: Secondary | ICD-10-CM | POA: Diagnosis not present

## 2020-06-02 DIAGNOSIS — Z20822 Contact with and (suspected) exposure to covid-19: Secondary | ICD-10-CM | POA: Diagnosis not present

## 2020-06-02 DIAGNOSIS — Z86718 Personal history of other venous thrombosis and embolism: Secondary | ICD-10-CM | POA: Diagnosis not present

## 2020-06-02 DIAGNOSIS — E785 Hyperlipidemia, unspecified: Secondary | ICD-10-CM | POA: Diagnosis not present

## 2020-06-02 DIAGNOSIS — Z79899 Other long term (current) drug therapy: Secondary | ICD-10-CM | POA: Diagnosis not present

## 2020-06-02 DIAGNOSIS — Z6841 Body Mass Index (BMI) 40.0 and over, adult: Secondary | ICD-10-CM | POA: Diagnosis not present

## 2020-06-02 DIAGNOSIS — I10 Essential (primary) hypertension: Secondary | ICD-10-CM | POA: Diagnosis not present

## 2020-06-02 DIAGNOSIS — I4892 Unspecified atrial flutter: Secondary | ICD-10-CM | POA: Diagnosis not present

## 2020-06-02 DIAGNOSIS — E559 Vitamin D deficiency, unspecified: Secondary | ICD-10-CM | POA: Diagnosis present

## 2020-06-02 DIAGNOSIS — I2510 Atherosclerotic heart disease of native coronary artery without angina pectoris: Secondary | ICD-10-CM | POA: Diagnosis not present

## 2020-06-02 DIAGNOSIS — E669 Obesity, unspecified: Secondary | ICD-10-CM | POA: Diagnosis present

## 2020-06-02 DIAGNOSIS — D6869 Other thrombophilia: Secondary | ICD-10-CM | POA: Diagnosis not present

## 2020-06-02 DIAGNOSIS — N3946 Mixed incontinence: Secondary | ICD-10-CM | POA: Diagnosis not present

## 2020-06-02 DIAGNOSIS — Z8711 Personal history of peptic ulcer disease: Secondary | ICD-10-CM | POA: Diagnosis not present

## 2020-06-02 DIAGNOSIS — Z7901 Long term (current) use of anticoagulants: Secondary | ICD-10-CM | POA: Diagnosis not present

## 2020-06-02 DIAGNOSIS — I4819 Other persistent atrial fibrillation: Secondary | ICD-10-CM | POA: Diagnosis not present

## 2020-06-02 DIAGNOSIS — Z881 Allergy status to other antibiotic agents status: Secondary | ICD-10-CM | POA: Diagnosis not present

## 2020-06-02 DIAGNOSIS — Z01812 Encounter for preprocedural laboratory examination: Secondary | ICD-10-CM | POA: Insufficient documentation

## 2020-06-02 LAB — SARS CORONAVIRUS 2 (TAT 6-24 HRS): SARS Coronavirus 2: NEGATIVE

## 2020-06-05 ENCOUNTER — Encounter (HOSPITAL_COMMUNITY): Payer: Self-pay | Admitting: Physician Assistant

## 2020-06-05 ENCOUNTER — Ambulatory Visit (HOSPITAL_COMMUNITY)
Admission: RE | Admit: 2020-06-05 | Discharge: 2020-06-05 | Disposition: A | Discharge status: Home / Self Care | Payer: BC Managed Care – PPO | Source: Ambulatory Visit | Attending: Physician Assistant | Admitting: Physician Assistant

## 2020-06-05 ENCOUNTER — Encounter (HOSPITAL_COMMUNITY): Payer: Self-pay | Admitting: Cardiology

## 2020-06-05 ENCOUNTER — Other Ambulatory Visit: Payer: Self-pay

## 2020-06-05 ENCOUNTER — Inpatient Hospital Stay (HOSPITAL_COMMUNITY)
Admission: RE | Admit: 2020-06-05 | Discharge: 2020-06-08 | DRG: 309 | Disposition: A | Discharge status: Home / Self Care | Payer: BC Managed Care – PPO | Source: Ambulatory Visit | Attending: Cardiology | Admitting: Cardiology

## 2020-06-05 VITALS — BP 124/74 | HR 109 | Ht 63.0 in | Wt 280.0 lb

## 2020-06-05 DIAGNOSIS — K219 Gastro-esophageal reflux disease without esophagitis: Secondary | ICD-10-CM | POA: Diagnosis present

## 2020-06-05 DIAGNOSIS — E669 Obesity, unspecified: Secondary | ICD-10-CM | POA: Diagnosis present

## 2020-06-05 DIAGNOSIS — E785 Hyperlipidemia, unspecified: Secondary | ICD-10-CM | POA: Diagnosis present

## 2020-06-05 DIAGNOSIS — Z86010 Personal history of colonic polyps: Secondary | ICD-10-CM | POA: Diagnosis not present

## 2020-06-05 DIAGNOSIS — Z8711 Personal history of peptic ulcer disease: Secondary | ICD-10-CM

## 2020-06-05 DIAGNOSIS — I2510 Atherosclerotic heart disease of native coronary artery without angina pectoris: Secondary | ICD-10-CM | POA: Diagnosis present

## 2020-06-05 DIAGNOSIS — E559 Vitamin D deficiency, unspecified: Secondary | ICD-10-CM | POA: Diagnosis present

## 2020-06-05 DIAGNOSIS — Z20822 Contact with and (suspected) exposure to covid-19: Secondary | ICD-10-CM | POA: Diagnosis present

## 2020-06-05 DIAGNOSIS — Z86718 Personal history of other venous thrombosis and embolism: Secondary | ICD-10-CM

## 2020-06-05 DIAGNOSIS — I4819 Other persistent atrial fibrillation: Secondary | ICD-10-CM | POA: Diagnosis present

## 2020-06-05 DIAGNOSIS — Z6841 Body Mass Index (BMI) 40.0 and over, adult: Secondary | ICD-10-CM | POA: Diagnosis not present

## 2020-06-05 DIAGNOSIS — Z79899 Other long term (current) drug therapy: Secondary | ICD-10-CM

## 2020-06-05 DIAGNOSIS — I4892 Unspecified atrial flutter: Secondary | ICD-10-CM | POA: Diagnosis present

## 2020-06-05 DIAGNOSIS — Z7901 Long term (current) use of anticoagulants: Secondary | ICD-10-CM | POA: Diagnosis not present

## 2020-06-05 DIAGNOSIS — Z881 Allergy status to other antibiotic agents status: Secondary | ICD-10-CM

## 2020-06-05 DIAGNOSIS — D6869 Other thrombophilia: Secondary | ICD-10-CM | POA: Diagnosis present

## 2020-06-05 DIAGNOSIS — N3946 Mixed incontinence: Secondary | ICD-10-CM | POA: Diagnosis present

## 2020-06-05 DIAGNOSIS — I10 Essential (primary) hypertension: Secondary | ICD-10-CM | POA: Diagnosis present

## 2020-06-05 LAB — BASIC METABOLIC PANEL
Anion gap: 8 (ref 5–15)
BUN: 15 mg/dL (ref 8–23)
CO2: 24 mmol/L (ref 22–32)
Calcium: 9.1 mg/dL (ref 8.9–10.3)
Chloride: 108 mmol/L (ref 98–111)
Creatinine, Ser: 0.89 mg/dL (ref 0.44–1.00)
GFR, Estimated: >60 mL/min (ref >60)
Glucose, Bld: 109 mg/dL — ABNORMAL HIGH (ref 70–99)
Potassium: 3.9 mmol/L (ref 3.5–5.1)
Sodium: 140 mmol/L (ref 135–145)

## 2020-06-05 LAB — HIV ANTIBODY (ROUTINE TESTING W REFLEX): HIV Screen 4th Generation wRfx: NONREACTIVE

## 2020-06-05 LAB — MAGNESIUM: Magnesium: 2 mg/dL (ref 1.7–2.4)

## 2020-06-05 MED ORDER — DOFETILIDE 500 MCG PO CAPS
500.0000 ug | ORAL_CAPSULE | Freq: Two times a day (BID) | ORAL | Status: DC
  Administered 2020-06-05 – 2020-06-06 (×2): 500 ug via ORAL
  Filled 2020-06-05 (×2): qty 1

## 2020-06-05 MED ORDER — RIVAROXABAN 20 MG PO TABS
20.0000 mg | ORAL_TABLET | Freq: Every day | ORAL | Status: DC
  Administered 2020-06-05 – 2020-06-07 (×3): 20 mg via ORAL
  Filled 2020-06-05 (×3): qty 1

## 2020-06-05 MED ORDER — SODIUM CHLORIDE 0.9 % IV SOLN: 250.0000 mL | INTRAVENOUS | Status: DC | PRN

## 2020-06-05 MED ORDER — SODIUM CHLORIDE 0.9% FLUSH
3.0000 mL | Freq: Two times a day (BID) | INTRAVENOUS | Status: DC
  Administered 2020-06-06 – 2020-06-07 (×4): 3 mL via INTRAVENOUS

## 2020-06-05 MED ORDER — VITAMIN D 25 MCG (1000 UNIT) PO TABS
5000.0000 [IU] | ORAL_TABLET | Freq: Every day | ORAL | Status: DC
  Administered 2020-06-06 – 2020-06-07 (×2): 5000 [IU] via ORAL
  Filled 2020-06-05 (×2): qty 5

## 2020-06-05 MED ORDER — MAGNESIUM SULFATE 2 GM/50ML IV SOLN
2.0000 g | Freq: Once | INTRAVENOUS | Status: AC
  Administered 2020-06-05: 2 g via INTRAVENOUS
  Filled 2020-06-05: qty 50

## 2020-06-05 MED ORDER — DILTIAZEM HCL ER COATED BEADS 120 MG PO CP24
120.0000 mg | ORAL_CAPSULE | Freq: Every day | ORAL | Status: DC
  Administered 2020-06-05: 120 mg via ORAL
  Filled 2020-06-05: qty 1

## 2020-06-05 MED ORDER — SODIUM CHLORIDE 0.9% FLUSH: 3.0000 mL | INTRAVENOUS | Status: DC | PRN

## 2020-06-05 MED ORDER — POTASSIUM CHLORIDE CRYS ER 20 MEQ PO TBCR
40.0000 meq | EXTENDED_RELEASE_TABLET | Freq: Once | ORAL | Status: AC
  Administered 2020-06-05: 40 meq via ORAL
  Filled 2020-06-05: qty 2

## 2020-06-05 MED ORDER — LORATADINE 10 MG PO TABS
10.0000 mg | ORAL_TABLET | Freq: Every day | ORAL | Status: DC
  Filled 2020-06-05 (×2): qty 1

## 2020-06-05 MED ORDER — VITAMIN E 45 MG (100 UNIT) PO CAPS
400.0000 [IU] | ORAL_CAPSULE | Freq: Every day | ORAL | Status: DC
  Administered 2020-06-06 – 2020-06-08 (×3): 400 [IU] via ORAL
  Filled 2020-06-05 (×3): qty 4

## 2020-06-05 MED ORDER — ROSUVASTATIN CALCIUM 5 MG PO TABS
10.0000 mg | ORAL_TABLET | Freq: Every day | ORAL | Status: DC
  Administered 2020-06-05 – 2020-06-07 (×3): 10 mg via ORAL
  Filled 2020-06-05 (×3): qty 2

## 2020-06-05 MED ORDER — DOCUSATE SODIUM 100 MG PO CAPS
100.0000 mg | ORAL_CAPSULE | Freq: Every day | ORAL | Status: DC
  Administered 2020-06-06 – 2020-06-08 (×3): 100 mg via ORAL
  Filled 2020-06-05 (×3): qty 1

## 2020-06-05 MED ORDER — FESOTERODINE FUMARATE ER 8 MG PO TB24
8.0000 mg | ORAL_TABLET | Freq: Every day | ORAL | Status: DC
  Filled 2020-06-05: qty 1

## 2020-06-05 MED ORDER — METOPROLOL SUCCINATE ER 100 MG PO TB24
100.0000 mg | ORAL_TABLET | Freq: Two times a day (BID) | ORAL | Status: DC
  Administered 2020-06-05 – 2020-06-08 (×6): 100 mg via ORAL
  Filled 2020-06-05 (×6): qty 1

## 2020-06-05 NOTE — Progress Notes (Signed)
Primary Care Physician: Mila Palmer, MD Primary Cardiologist: Dr Bjorn Pippin Primary Electrophysiologist: Dr Lalla Brothers (new) Referring Physician: Dr Donnamarie Poag Keeble is a 70 y.o. female with a history of hypertension, hyperlipidemia, peptic ulcer disease, and atrial fibrillation who presents for follow up in the [facility]. The patient was initially diagnosed with atrial fibrillation 01/07/20 after presenting with symptoms of increased fatigue. Patient is on Xarelto for a CHADS2VASC score of 4. She underwent DCCV on 02/01/20. Unfortunately, she felt she was back out of rhythm 02/04/20. She was under a considerable amount of stress, both at home and at work, which she feels contributed to her afib recurrence. Patient is s/p DCCV on 03/09/20 after starting flecainide however she was back in atypical atrial flutter on follow up. She states that she actually felt worse in SR with severe fatigue. The decision was made at that point for rate control.  On follow up today, patient presents for dofetilide admission. She denies any missed doses of anticoagulation in the last 3 weeks. She remains in atrial fibrillation with mildly elevated rates.   Today, she denies symptoms of chest pain, shortness of breath, orthopnea, PND, lower extremity edema, dizziness, presyncope, syncope, bleeding, or neurologic sequela. The patient is tolerating medications without difficulties and is otherwise without complaint today.    Atrial Fibrillation Risk Factors:  she does not have symptoms or diagnosis of sleep apnea. Did not qualify for CPAP on sleep study. she does not have a history of rheumatic fever. The patient does not have a history of early familial atrial fibrillation or other arrhythmias.  she has a BMI of Body mass index is 49.6 kg/m.Marland Kitchen Filed Weights   06/05/20 0943  Weight: 127 kg    No family history on file.   Atrial Fibrillation Management  history:  Previous antiarrhythmic drugs: flecainide  Previous cardioversions: 02/01/20, 03/09/20 Previous ablations: none CHADS2VASC score: 4 Anticoagulation history: Xarelto   Past Medical History:  Diagnosis Date  . Atrophic vaginitis   . Chronic edema    no DVT  . GERD (gastroesophageal reflux disease)   . Hyperlipidemia   . Hypertension    echo - 12/22/2006- Normal EF 60% mild left atrial enlargement ,difficult windows   . PUD (peptic ulcer disease)    antral ulcer on EGD 2008- Dr Loreta Ave  . Sessile colonic polyp    personal hx  . Unspecified essential hypertension    nuclear stress-12/22/06 - low risk   . Urge and stress incontinence   . Vitamin D deficiency    Past Surgical History:  Procedure Laterality Date  . CARDIOVERSION N/A 02/01/2020   Procedure: CARDIOVERSION;  Surgeon: Little Ishikawa, MD;  Location: [facility] ENDOSCOPY;  Service: Cardiovascular;  Laterality: N/A;  . CARDIOVERSION N/A 03/09/2020   Procedure: CARDIOVERSION;  Surgeon: Lars Masson, MD;  Location: [facility] ENDOSCOPY;  Service: Cardiovascular;  Laterality: N/A;  . TEE WITHOUT CARDIOVERSION N/A 02/01/2020   Procedure: TRANSESOPHAGEAL ECHOCARDIOGRAM (TEE);  Surgeon: Little Ishikawa, MD;  Location: [facility] ENDOSCOPY;  Service: Cardiovascular;  Laterality: N/A;    Current Outpatient Medications  Medication Sig Dispense Refill  . Cholecalciferol (VITAMIN D-3) 125 MCG (5000 UT) TABS Take 5,000 Units by mouth daily at 12 noon.    . diltiazem (CARDIZEM CD) 120 MG 24 hr capsule Take 1 capsule (120 mg total) by mouth daily. 90 capsule 3  . docusate sodium (COLACE) 100 MG capsule Take 100 mg by mouth daily.    Marland Kitchen loratadine (  CLARITIN) 10 MG tablet Take 10 mg by mouth daily.    . metoprolol succinate (TOPROL-XL) 100 MG 24 hr tablet Take 1 tablet (100 mg total) by mouth in the morning and at bedtime. 180 tablet 3  . rivaroxaban (XARELTO) 20 MG TABS tablet Take 1 tablet (20 mg total) by mouth daily with supper. 90  tablet 3  . rosuvastatin (CRESTOR) 10 MG tablet Take 1 tablet (10 mg total) by mouth daily. 90 tablet 3  . tolterodine (DETROL LA) 4 MG 24 hr capsule Take 4 mg by mouth daily.    . vitamin E 180 MG (400 UNITS) capsule Take 400 Units by mouth daily.     No current facility-administered medications for this encounter.    Allergies  Allergen Reactions  . Neosporin [Neomycin-Bacitracin Zn-Polymyx]     ( triple) WBC inflamation: allergy    Social History   Socioeconomic History  . Marital status: Married    Spouse name: Not on file  . Number of children: Not on file  . Years of education: Not on file  . Highest education level: Not on file  Occupational History  . Not on file  Tobacco Use  . Smoking status: Never Smoker  . Smokeless tobacco: Never Used  Substance and Sexual Activity  . Alcohol use: Yes    Alcohol/week: 1.0 standard drink    Types: 1 Standard drinks or equivalent per week  . Drug use: Never  . Sexual activity: Yes    Partners: Male  Other Topics Concern  . Not on file  Social History Narrative   History of smoking: Never smoked   No smoking   No Alcohol   Caffeine : yes soda ,diet coke -occas.   No recreational drug use.   No exercise   Occupation : Adult nurse   Married , Yes  daughter recent grad [employer]   Children 1 daughter age 4 and 1 son age 56   Sexually active : currently sexually active   No HRT   GYN History..   Last pap smear date 2009   Last mammogram date 10/.21/2009   Abnormal pap smear none.                  Social Determinants of Health   Financial Resource Strain: Not on file  Food Insecurity: Not on file  Transportation Needs: Not on file  Physical Activity: Not on file  Stress: Not on file  Social Connections: Not on file  Intimate Partner Violence: Not on file     ROS- All systems are reviewed and negative except as per the HPI above.  Physical Exam: Vitals:   06/05/20 0943  BP: 124/74  Pulse: (!)  109  Weight: 127 kg  Height: 5\' 3"  (1.6 m)    GEN- The patient is a well appearing obese female, alert and oriented x 3 today.   HEENT-head normocephalic, atraumatic, sclera clear, conjunctiva pink, hearing intact, trachea midline. Lungs- Clear to ausculation bilaterally, normal work of breathing Heart- irregular rate and rhythm, no murmurs, rubs or gallops  GI- soft, NT, ND, + BS Extremities- no clubbing, cyanosis, or edema MS- no significant deformity or atrophy Skin- no rash or lesion Psych- euthymic mood, full affect Neuro- strength and sensation are intact   Wt Readings from Last 3 Encounters:  06/05/20 127 kg  05/17/20 126.9 kg  05/03/20 125.6 kg    EKG today demonstrates  Afib Vent. rate 109 BPM PR interval * ms QRS  duration 78 ms QT/QTcB 352/474 ms  Echo 02/07/20 demonstrated  1. Left ventricular ejection fraction, by estimation, is 55 to 60%. The  left ventricle has normal function. The left ventricle has no regional  wall motion abnormalities. There is mild concentric left ventricular  hypertrophy. Left ventricular diastolic parameters are indeterminate.  2. Right ventricular systolic function is normal. The right ventricular  size is normal. There is normal pulmonary artery systolic pressure.  3. Planimetry has underestimated the atrial size: manual measurement is would yield a index volume of 45 cc/BSA. Left atrial size was mild to moderately dilated.  4. Planimetry has underestimated the atrial size: manual measurement is would yield a index volume of 23 cc/BSA.  5. The mitral valve is grossly normal. Trivial mitral valve  regurgitation.  6. The aortic valve is tricuspid. Aortic valve regurgitation is not  visualized. No aortic stenosis is present.  7. The inferior vena cava is normal in size with greater than 50%  respiratory variability, suggesting right atrial pressure of 3 mmHg.   Epic records are reviewed at length today  CHA2DS2-VASc Score = 4   The patient's score is based upon: CHF History: No HTN History: Yes Diabetes History: No Stroke History: No Vascular Disease History: Yes Age Score: 1 Gender Score: 1      ASSESSMENT AND PLAN: 1. Persistent Atrial Fibrillation/atrial flutter The patient's CHA2DS2-VASc score is 4, indicating a 4.8% annual risk of stroke.   Patient presents for dofetilide loading. She aware of price of dofetilide. Continue Xarelto 20 mg daily, patient denies any missed doses in the last 3 weeks. PharmD has screened medications.  QTc in SR 442 ms Labs today show creatinine at 0.89, K+ 3.9 and mag 2.0, CrCl calculated at 120 mL/min Continue Toprol 100 mg BID Continue diltiazem 120 mg daily  2. Secondary Hypercoagulable State (ICD10:  D68.69) The patient is at significant risk for stroke/thromboembolism based upon her CHA2DS2-VASc Score of 4.  Continue Rivaroxaban (Xarelto).   3. Obesity Body mass index is 49.6 kg/m. Lifestyle modification was discussed and encouraged including regular physical activity and weight reduction.  4. Sleep disordered breathing  Sleep study did not show significant OSA, CPAP not recommended.   5. HTN Stable, no changes today.  6. CAD Elevated calcium score 173, 81st percentile.  No anginal symptoms.   To be admitted later today once a bed becomes available.    Jorja Loa PA-C Afib Clinic [facility] [address] [place], [place] [address] [phone] 06/05/2020 9:58 AM

## 2020-06-05 NOTE — H&P (Signed)
Primary Care Physician: Mila Palmer, MD Primary Cardiologist: Dr Bjorn Pippin Primary Electrophysiologist: Dr Lalla Brothers (new) Referring Physician: Dr Donnamarie Poag Embree is a 70 y.o. female with a history of hypertension, hyperlipidemia, peptic ulcer disease, and atrial fibrillation who presents for follow up in the [facility]. The patient was initially diagnosed with atrial fibrillation 01/07/20 after presenting with symptoms of increased fatigue. Patient is on Xarelto for a CHADS2VASC score of 4. She underwent DCCV on 02/01/20. Unfortunately, she felt she was back out of rhythm 02/04/20. She was under a considerable amount of stress, both at home and at work, which she feels contributed to her afib recurrence. Patient is s/p DCCV on 03/09/20 after starting flecainide however she was back in atypical atrial flutter on follow up. She states that she actually felt worse in SR with severe fatigue. The decision was made at that point for rate control.  On follow up today, patient presents for dofetilide admission. She denies any missed doses of anticoagulation in the last 3 weeks. She remains in atrial fibrillation with mildly elevated rates.   Today, she denies symptoms of chest pain, shortness of breath, orthopnea, PND, lower extremity edema, dizziness, presyncope, syncope, bleeding, or neurologic sequela. The patient is tolerating medications without difficulties and is otherwise without complaint today.    Atrial Fibrillation Risk Factors:  she does not have symptoms or diagnosis of sleep apnea. Did not qualify for CPAP on sleep study. she does not have a history of rheumatic fever. The patient does not have a history of early familial atrial fibrillation or other arrhythmias.  she has a BMI of Body mass index is 49.6 kg/m.Marland Kitchen    Filed Weights   06/05/20 0943  Weight: 127 kg    No family history on file.   Atrial Fibrillation Management  history:  Previous antiarrhythmic drugs: flecainide  Previous cardioversions: 02/01/20, 03/09/20 Previous ablations: none CHADS2VASC score: 4 Anticoagulation history: Xarelto       Past Medical History:  Diagnosis Date  . Atrophic vaginitis   . Chronic edema    no DVT  . GERD (gastroesophageal reflux disease)   . Hyperlipidemia   . Hypertension    echo - 12/22/2006- Normal EF 60% mild left atrial enlargement ,difficult windows   . PUD (peptic ulcer disease)    antral ulcer on EGD 2008- Dr Loreta Ave  . Sessile colonic polyp    personal hx  . Unspecified essential hypertension    nuclear stress-12/22/06 - low risk   . Urge and stress incontinence   . Vitamin D deficiency         Past Surgical History:  Procedure Laterality Date  . CARDIOVERSION N/A 02/01/2020   Procedure: CARDIOVERSION;  Surgeon: Little Ishikawa, MD;  Location: [facility] ENDOSCOPY;  Service: Cardiovascular;  Laterality: N/A;  . CARDIOVERSION N/A 03/09/2020   Procedure: CARDIOVERSION;  Surgeon: Lars Masson, MD;  Location: [facility] ENDOSCOPY;  Service: Cardiovascular;  Laterality: N/A;  . TEE WITHOUT CARDIOVERSION N/A 02/01/2020   Procedure: TRANSESOPHAGEAL ECHOCARDIOGRAM (TEE);  Surgeon: Little Ishikawa, MD;  Location: [facility] ENDOSCOPY;  Service: Cardiovascular;  Laterality: N/A;          Current Outpatient Medications  Medication Sig Dispense Refill  . Cholecalciferol (VITAMIN D-3) 125 MCG (5000 UT) TABS Take 5,000 Units by mouth daily at 12 noon.    . diltiazem (CARDIZEM CD) 120 MG 24 hr capsule Take 1 capsule (120 mg total) by mouth daily. 90 capsule 3  .  docusate sodium (COLACE) 100 MG capsule Take 100 mg by mouth daily.    Marland Kitchen loratadine (CLARITIN) 10 MG tablet Take 10 mg by mouth daily.    . metoprolol succinate (TOPROL-XL) 100 MG 24 hr tablet Take 1 tablet (100 mg total) by mouth in the morning and at bedtime. 180 tablet 3  . rivaroxaban (XARELTO) 20 MG TABS tablet Take 1 tablet  (20 mg total) by mouth daily with supper. 90 tablet 3  . rosuvastatin (CRESTOR) 10 MG tablet Take 1 tablet (10 mg total) by mouth daily. 90 tablet 3  . tolterodine (DETROL LA) 4 MG 24 hr capsule Take 4 mg by mouth daily.    . vitamin E 180 MG (400 UNITS) capsule Take 400 Units by mouth daily.     No current facility-administered medications for this encounter.         Allergies  Allergen Reactions  . Neosporin [Neomycin-Bacitracin Zn-Polymyx]     ( triple) WBC inflamation: allergy    Social History        Socioeconomic History  . Marital status: Married    Spouse name: Not on file  . Number of children: Not on file  . Years of education: Not on file  . Highest education level: Not on file  Occupational History  . Not on file  Tobacco Use  . Smoking status: Never Smoker  . Smokeless tobacco: Never Used  Substance and Sexual Activity  . Alcohol use: Yes    Alcohol/week: 1.0 standard drink    Types: 1 Standard drinks or equivalent per week  . Drug use: Never  . Sexual activity: Yes    Partners: Male  Other Topics Concern  . Not on file  Social History Narrative   History of smoking: Never smoked   No smoking   No Alcohol   Caffeine : yes soda ,diet coke -occas.   No recreational drug use.   No exercise   Occupation : Adult nurse   Married , Yes  daughter recent grad [employer]   Children 1 daughter age 84 and 1 son age 15   Sexually active : currently sexually active   No HRT   GYN History..   Last pap smear date 2009   Last mammogram date 10/.21/2009   Abnormal pap smear none.                  Social Determinants of Health   Financial Resource Strain: Not on file  Food Insecurity: Not on file  Transportation Needs: Not on file  Physical Activity: Not on file  Stress: Not on file  Social Connections: Not on file  Intimate Partner Violence: Not on file     ROS- All systems are reviewed and  negative except as per the HPI above.  Physical Exam:    Vitals:   06/05/20 0943  BP: 124/74  Pulse: (!) 109  Weight: 127 kg  Height: 5\' 3"  (1.6 m)    GEN- The patient is a well appearing obese female, alert and oriented x 3 today.   HEENT-head normocephalic, atraumatic, sclera clear, conjunctiva pink, hearing intact, trachea midline. Lungs- Clear to ausculation bilaterally, normal work of breathing Heart- irregular rate and rhythm, no murmurs, rubs or gallops  GI- soft, NT, ND, + BS Extremities- no clubbing, cyanosis, or edema MS- no significant deformity or atrophy Skin- no rash or lesion Psych- euthymic mood, full affect Neuro- strength and sensation are intact      Wt Readings  from Last 3 Encounters:  06/05/20 127 kg  05/17/20 126.9 kg  05/03/20 125.6 kg    EKG today demonstrates  Afib Vent. rate 109 BPM PR interval * ms QRS duration 78 ms QT/QTcB 352/474 ms  Echo 02/07/20 demonstrated  1. Left ventricular ejection fraction, by estimation, is 55 to 60%. The  left ventricle has normal function. The left ventricle has no regional  wall motion abnormalities. There is mild concentric left ventricular  hypertrophy. Left ventricular diastolic parameters are indeterminate.  2. Right ventricular systolic function is normal. The right ventricular  size is normal. There is normal pulmonary artery systolic pressure.  3. Planimetry has underestimated the atrial size: manual measurement is would yield a index volume of 45 cc/BSA. Left atrial size was mild to moderately dilated.  4. Planimetry has underestimated the atrial size: manual measurement is would yield a index volume of 23 cc/BSA.  5. The mitral valve is grossly normal. Trivial mitral valve  regurgitation.  6. The aortic valve is tricuspid. Aortic valve regurgitation is not  visualized. No aortic stenosis is present.  7. The inferior vena cava is normal in size with greater than 50%  respiratory  variability, suggesting right atrial pressure of 3 mmHg.   Epic records are reviewed at length today  CHA2DS2-VASc Score = 4  The patient's score is based upon: CHF History: No HTN History: Yes Diabetes History: No Stroke History: No Vascular Disease History: Yes Age Score: 1 Gender Score: 1      ASSESSMENT AND PLAN: 1. Persistent Atrial Fibrillation/atrial flutter The patient's CHA2DS2-VASc score is 4, indicating a 4.8% annual risk of stroke.   Patient presents for dofetilide loading. She aware of price of dofetilide. Continue Xarelto 20 mg daily, patient denies any missed doses in the last 3 weeks. PharmD has screened medications.  QTc in SR 442 ms Labs today show creatinine at 0.89, K+ 3.9 and mag 2.0, CrCl calculated at 120 mL/min Continue Toprol 100 mg BID Continue diltiazem 120 mg daily  2. Secondary Hypercoagulable State (ICD10:  D68.69) The patient is at significant risk for stroke/thromboembolism based upon her CHA2DS2-VASc Score of 4.  Continue Rivaroxaban (Xarelto).   3. Obesity Body mass index is 49.6 kg/m. Lifestyle modification was discussed and encouraged including regular physical activity and weight reduction.  4. Sleep disordered breathing  Sleep study did not show significant OSA, CPAP not recommended.   5. HTN Stable, no changes today.  6. CAD Elevated calcium score 173, 81st percentile.  No anginal symptoms.  ---------------------------------------------------------------------  I have seen, examined the patient, and reviewed the above assessment and plan.    Presenting for dofetilide loading. QTc appropriate.  Lanier Prude, MD 06/05/2020 11:24 PM

## 2020-06-05 NOTE — Progress Notes (Signed)
Pharmacy: Dofetilide (Tikosyn) - Initial Consult Assessment and Electrolyte Replacement  Pharmacy consulted to assist in monitoring and replacing electrolytes in this 70 y.o. female admitted on 06/05/2020 undergoing dofetilide initiation. First dofetilide dose: 06/05/20  Assessment:  Patient Exclusion Criteria: If any screening criteria checked as "Yes", then  patient  should NOT receive dofetilide until criteria item is corrected.  If "Yes" please indicate correction plan.  YES  NO Patient  Exclusion Criteria Correction Plan   []   [x]   Baseline QTc interval is greater than or equal to 440 msec. IF above YES box checked dofetilide contraindicated unless patient has ICD; then may proceed if QTc 500-550 msec or with known ventricular conduction abnormalities may proceed with QTc 550-600 msec. QTc =    []   [x]   Patient is known or suspected to have a digoxin level greater than 2 ng/ml: No results found for: DIGOXIN     []   [x]   Creatinine clearance less than 20 ml/min (calculated using Cockcroft-Gault, actual body weight and serum creatinine): Estimated Creatinine Clearance: 77.5 mL/min (by C-G formula based on SCr of 0.89 mg/dL).     []   [x]  Patient has received drugs known to prolong the QT intervals within the last 48 hours (phenothiazines, tricyclics or tetracyclic antidepressants, erythromycin, H-1 antihistamines, cisapride, fluoroquinolones, azithromycin, ondansetron).   Updated information on QT prolonging agents is available to be searched on the following database:QT prolonging agents     []   [x]   Patient received a dose of hydrochlorothiazide (Oretic) alone or in any combination including triamterene (Dyazide, Maxzide) in the last 48 hours.    []   [x]  Patient received a medication known to increase dofetilide plasma concentrations prior to initial dofetilide dose:  . Trimethoprim (Primsol, Proloprim) in the last 36 hours . Verapamil (Calan, Verelan) in the last 36  hours or a sustained release dose in the last 72 hours . Megestrol (Megace) in the last 5 days  . Cimetidine (Tagamet) in the last 6 hours . Ketoconazole (Nizoral) in the last 24 hours . Itraconazole (Sporanox) in the last 48 hours  . Prochlorperazine (Compazine) in the last 36 hours     []   [x]   Patient is known to have a history of torsades de pointes; congenital or acquired long QT syndromes.    []   [x]   Patient has received a Class 1 antiarrhythmic with less than 2 half-lives since last dose. (Disopyramide, Quinidine, Procainamide, Lidocaine, Mexiletine, Flecainide, Propafenone)    []   [x]   Patient has received amiodarone therapy in the past 3 months or amiodarone level is greater than 0.3 ng/ml.    Patient has been appropriately anticoagulated with xarelto.  Labs:    Component Value Date/Time   K 3.9 06/05/2020 0929   MG 2.0 06/05/2020     Plan: Potassium: K 3.8-3.9:  Hold Tikosyn initiation and give KCl 40 mEq po x1 then begin Tikosyn at least 2hr after KCl dose - do not need to recheck K   Magnesium: Mg 1.8-2: Give Mg 2 gm IV x1 to prevent Mg from dropping below 1.8 - do not need to recheck Mg. Appropriate to initiate Tikosyn   Thank you for allowing pharmacy to participate in this patient's care   PharmD., BCPS Clinical Pharmacist 06/05/2020 4:56 PM

## 2020-06-05 NOTE — Progress Notes (Signed)
Pt labs reviewed K+ 3.9, replacement ordered, no need to recheck prior start of drub tonight Mag 2.0 Creat 0.89, dose is appropriately  Francis Dowse, PA-C

## 2020-06-05 NOTE — Progress Notes (Signed)
EKG was performed 2 hours after initial Tikosyn dose; QTc resulted at 457. Patient tolerated well, will continue to monitor.   Bari Edward

## 2020-06-06 LAB — BASIC METABOLIC PANEL
Anion gap: 8 (ref 5–15)
BUN: 16 mg/dL (ref 8–23)
CO2: 25 mmol/L (ref 22–32)
Calcium: 8.8 mg/dL — ABNORMAL LOW (ref 8.9–10.3)
Chloride: 106 mmol/L (ref 98–111)
Creatinine, Ser: 0.88 mg/dL (ref 0.44–1.00)
GFR, Estimated: >60 mL/min (ref >60)
Glucose, Bld: 101 mg/dL — ABNORMAL HIGH (ref 70–99)
Potassium: 4.3 mmol/L (ref 3.5–5.1)
Sodium: 139 mmol/L (ref 135–145)

## 2020-06-06 LAB — MAGNESIUM: Magnesium: 2.2 mg/dL (ref 1.7–2.4)

## 2020-06-06 MED ORDER — FESOTERODINE FUMARATE ER 8 MG PO TB24
8.0000 mg | ORAL_TABLET | Freq: Every day | ORAL | Status: DC
  Administered 2020-06-06 – 2020-06-08 (×3): 8 mg via ORAL
  Filled 2020-06-06 (×3): qty 1

## 2020-06-06 MED ORDER — DOFETILIDE 250 MCG PO CAPS
250.0000 ug | ORAL_CAPSULE | Freq: Two times a day (BID) | ORAL | Status: DC
  Administered 2020-06-06 – 2020-06-08 (×4): 250 ug via ORAL
  Filled 2020-06-06 (×4): qty 1

## 2020-06-06 NOTE — Progress Notes (Signed)
Pharmacy: Dofetilide (Tikosyn) - Follow Up Assessment and Electrolyte Replacement  Pharmacy consulted to assist in monitoring and replacing electrolytes in this 70 y.o. female admitted on 06/05/2020 undergoing dofetilide initiation.   Labs:    Component Value Date/Time   K 4.3 06/06/2020 0524   MG 2.2 06/06/2020 0524     Plan: Potassium: K >/= 4: No additional supplementation needed  Magnesium: Mg > 2: No additional supplementation needed   Thank you for allowing pharmacy to participate in this patient's care   Harland German, PharmD Clinical Pharmacist **Pharmacist phone directory can now be found on [email or web address] ([email or web address]).  Listed under [facility].

## 2020-06-06 NOTE — Progress Notes (Addendum)
Progress Note  Patient Name: Megan Waters Date of Encounter: 06/06/2020  [facility] Cardiologist: Dr. Bjorn Pippin  Subjective   Very happy to hear she is back in SR, tolerating drug well, no complaints this AM  Inpatient Medications    Scheduled Meds: . cholecalciferol  5,000 Units Oral Q1200  . diltiazem  120 mg Oral Daily  . docusate sodium  100 mg Oral Daily  . dofetilide  500 mcg Oral BID  . fesoterodine  8 mg Oral Daily  . loratadine  10 mg Oral Daily  . metoprolol succinate  100 mg Oral BID  . rivaroxaban  20 mg Oral Q supper  . rosuvastatin  10 mg Oral QHS  . sodium chloride flush  3 mL Intravenous Q12H  . vitamin E  400 Units Oral Daily   Continuous Infusions: . sodium chloride     PRN Meds: sodium chloride, sodium chloride flush   Vital Signs    Vitals:   06/05/20 2004 06/06/20 0035 06/06/20 0506 06/06/20 0806  BP:  118/65 (!) 95/57 107/69  Pulse: 90 (!) 58  69  Resp:  16 18   Temp:  97.6 F (36.4 C) 97.6 F (36.4 C)   TempSrc:  Oral Oral   SpO2:  100% 97%   Weight:      Height:        Intake/Output Summary (Last 24 hours) at 06/06/2020 0918 Last data filed at 06/06/2020 0820 Gross per 24 hour  Intake 480 ml  Output --  Net 480 ml   Last 3 Weights 06/05/2020 06/05/2020 05/17/2020  Weight (lbs) 280 lb 1.6 oz 280 lb 279 lb 12.8 oz  Weight (kg) 127.053 kg 127.007 kg 126.916 kg      Telemetry    SR 60's-70's - Personally Reviewed  ECG    SR 61bpm, manually measured QT , QTc - Personally Reviewed  Physical Exam   GEN: No acute distress.   Neck: No JVD Cardiac: RRR, no murmurs, rubs, or gallops.  Respiratory: CTA b/l. GI: Soft, nontender, non-distended  MS: 1+ edema; No deformity. Neuro:  Nonfocal  Psych: Normal affect   Labs    High Sensitivity Troponin:  No results for input(s): TROPONINIHS in the last 720 hours.    Chemistry Recent Labs  Lab 06/05/20 0929 06/06/20 0524  NA 140 139  K 3.9 4.3  CL 108 106  CO2  24 25  GLUCOSE 109* 101*  BUN 15 16  CREATININE 0.89 0.88  CALCIUM 9.1 8.8*  GFRNONAA >60 >60  ANIONGAP 8 8     HematologyNo results for input(s): WBC, RBC, HGB, HCT, MCV, MCH, MCHC, RDW, PLT in the last 168 hours.  BNPNo results for input(s): BNP, PROBNP in the last 168 hours.   DDimer No results for input(s): DDIMER in the last 168 hours.   Radiology    No results found.  Cardiac Studies   02/07/2020: TTE IMPRESSIONS  1. Left ventricular ejection fraction, by estimation, is 55 to 60%. The  left ventricle has normal function. The left ventricle has no regional  wall motion abnormalities. There is mild concentric left ventricular  hypertrophy. Left ventricular diastolic  parameters are indeterminate.  2. Right ventricular systolic function is normal. The right ventricular  size is normal. There is normal pulmonary artery systolic pressure.  3. Planimetry has underestimated the atrial size: manual measurement is  would yield a index volume of 45 cc/BSA. Left atrial size was mild to  moderately dilated.  4.  Planimetry has underestimated the atrial size: manual measurement is  would yield a index volume of 23 cc/BSA.  5. The mitral valve is grossly normal. Trivial mitral valve  regurgitation.  6. The aortic valve is tricuspid. Aortic valve regurgitation is not  visualized. No aortic stenosis is present.  7. The inferior vena cava is normal in size with greater than 50%  respiratory variability, suggesting right atrial pressure of 3 mmHg.   Comparison(s): No prior Echocardiogram.   Patient Profile     70 y.o. female w/PMHx og HTN, HLD PUD, prior DVT, and AFib admitted for Tikosyn initiation  Assessment & Plan    1. Persistent AFib     CHA2DS2Vasc is 4, on Xarelto appropriately dosed     Tikosyn load is in progress      K+ 4.3      Mag 2.2      Creat 0.88 (stable)      QTc stable  She has converted to SR with drug Will stop dilt and keep her home BB  2.  HTN     Stop diltiazem, SBPs 100's, continue BB   For questions or updates, please contact [facility] Please consult [email or web address] for contact info under        Signed, Sheilah Pigeon, PA-C  06/06/2020, 9:18 AM

## 2020-06-06 NOTE — Progress Notes (Signed)
EKG is reviewed with Dr. Lalla Brothers, agrees she has had some QT prolongation and we will reduce tonight's dose to  Francis Dowse, PA-C

## 2020-06-07 LAB — BASIC METABOLIC PANEL
Anion gap: 6 (ref 5–15)
BUN: 17 mg/dL (ref 8–23)
CO2: 27 mmol/L (ref 22–32)
Calcium: 9 mg/dL (ref 8.9–10.3)
Chloride: 106 mmol/L (ref 98–111)
Creatinine, Ser: 1.07 mg/dL — ABNORMAL HIGH (ref 0.44–1.00)
GFR, Estimated: 56 mL/min — ABNORMAL LOW (ref >60)
Glucose, Bld: 100 mg/dL — ABNORMAL HIGH (ref 70–99)
Potassium: 4.6 mmol/L (ref 3.5–5.1)
Sodium: 139 mmol/L (ref 135–145)

## 2020-06-07 LAB — MAGNESIUM: Magnesium: 2.3 mg/dL (ref 1.7–2.4)

## 2020-06-07 NOTE — Care Management (Signed)
06-07-20 1007 Benefits check submitted for Tikosyn. Case Manager will follow for cost and pharmacy of choice. Gala Lewandowsky, RN,BSN Case Manager

## 2020-06-07 NOTE — Discharge Summary (Signed)
ELECTROPHYSIOLOGY PROCEDURE DISCHARGE SUMMARY    Patient ID: Megan Waters,  MRN: [id number], DOB/AGE: 1950/02/22 70 y.o.  Admit date: 06/05/2020 Discharge date: 06/08/20  Primary Care Physician: Mila Palmer, MD  Primary Cardiologist: Dr. Bjorn Pippin Electrophysiologist: new, Dr. Lalla Brothers  Primary Discharge Diagnosis:  1.  Persistent  atrial fibrillation status post Tikosyn loading this admission      CHA2DS2Vasc is 4, on Xarelto  Secondary Discharge Diagnosis:  1. HTN 2. H/o DVT 3. PUD  Allergies  Allergen Reactions  . Neosporin [Neomycin-Bacitracin Zn-Polymyx]     ( triple) WBC inflamation: allergy     Procedures This Admission:  1.  Tikosyn loading   Brief HPI: Megan Waters is a 70 y.o. female with a past medical history as noted above.  They were referred to the AFib clinic in the outpatient setting for treatment options of atrial fibrillation.  Risks, benefits, and alternatives to Tikosyn were reviewed with the patient who wished to proceed.    Hospital Course:  The patient was admitted and Tikosyn was initiated.  Renal function and electrolytes were followed during the hospitalization.  The patient's QTc lengthened and required dose reduction the then remained stable.  She converted with drug and did not require DCCV.  Thepatient was monitored until discharge on telemetry which demonstrated SR.  On the day of discharge, she feels well, was examined by Dr Lalla Brothers who considered the patient stable for discharge to home.  Follow-up has been arranged with the AFib clinic in 1 week and with Dr Lalla Brothers in 4 weeks.    Tikosyn teaching was completed electrolyte replacement for home >  Nothing additional is required TOC will fill today for 1st month, I have sent Rx and refills to her preferred pharmacy and confirmed they have received the prescription  Physical Exam: Vitals:   06/07/20 2005 06/08/20 0511 06/08/20 0800 06/08/20 0815  BP: (!) 110/52 (!) 126/58  (!) 112/53 (!) 112/53  Pulse: 73 74 78 78  Resp: 17 18 16    Temp: 98.1 F (36.7 C) 98.1 F (36.7 C) 98.1 F (36.7 C)   TempSrc: Oral Oral Oral   SpO2: 100% 95% 97%   Weight:      Height:         GEN- The patient is well appearing, alert and oriented x 3 today.   HEENT: normocephalic, atraumatic; sclera clear, conjunctiva pink; hearing intact; oropharynx clear; neck supple, no JVP Lymph- no cervical lymphadenopathy Lungs- CTA b/l, normal work of breathing.  No wheezes, rales, rhonchi Heart- RRR, no murmurs, rubs or gallops, PMI not laterally displaced GI- soft, non-tender, non-distended Extremities- no clubbing, cyanosis, trace, more brawny type edema MS- no significant deformity or atrophy Skin- warm and dry, no rash or lesion Psych- euthymic mood, full affect Neuro- strength and sensation are intact   Labs:   Lab Results  Component Value Date   WBC 8.4 03/06/2020   HGB 15.9 (H) 03/06/2020   HCT 44.8 03/06/2020   MCV 89.4 03/06/2020   PLT 226 03/06/2020    Recent Labs  Lab 06/08/20 0318  NA 138  K 3.8  CL 104  CO2 27  BUN 15  CREATININE 0.97  CALCIUM 9.4  GLUCOSE 96     Discharge Medications:  Allergies as of 06/08/2020      Reactions   Neosporin [neomycin-bacitracin Zn-polymyx]    ( triple) WBC inflamation: allergy      Medication List    STOP taking these medications  diltiazem 120 MG 24 hr capsule Commonly known as: CARDIZEM CD     TAKE these medications   docusate sodium 100 MG capsule Commonly known as: COLACE Take 100 mg by mouth daily.   dofetilide 250 MCG capsule Commonly known as: TIKOSYN Take 1 capsule (250 mcg total) by mouth 2 (two) times daily.   loratadine 10 MG tablet Commonly known as: CLARITIN Take 10 mg by mouth daily as needed for allergies.   metoprolol succinate 100 MG 24 hr tablet Commonly known as: TOPROL-XL Take 1 tablet (100 mg total) by mouth in the morning and at bedtime.   rivaroxaban 20 MG Tabs  tablet Commonly known as: XARELTO Take 1 tablet (20 mg total) by mouth daily with supper.   rosuvastatin 10 MG tablet Commonly known as: CRESTOR Take 1 tablet (10 mg total) by mouth daily. What changed: when to take this   tolterodine 4 MG 24 hr capsule Commonly known as: DETROL LA Take 4 mg by mouth daily.   Vitamin D-3 125 MCG (5000 UT) Tabs Take 5,000 Units by mouth daily at 12 noon.   vitamin E 180 MG (400 UNITS) capsule Take 400 Units by mouth daily.       Disposition:  Discharge Instructions    Diet - low sodium heart healthy   Complete by: As directed    Increase activity slowly   Complete by: As directed    No wound care   Complete by: As directed       Follow-up Information    [facility] Follow up.   Specialty: Cardiology Why: 06/14/20 @ 11:30AM with R. Charlean Merl, PA-C Contact information: [address] [id number] mc [place] [address] [phone]       Lanier Prude, MD Follow up.   Specialties: Cardiology, Radiology Why: 07/14/20 @ 3:15PM Contact information: [address] [place] [address] [phone]               Duration of Discharge Encounter: Greater than 30 minutes including physician time.  Norma Fredrickson, PA-C 06/08/2020 10:50 AM

## 2020-06-07 NOTE — Progress Notes (Signed)
EKG performed 2 hours after evening Tikosyn dose; QTc resulted at 479. Patient tolerated well. Will continue to monitor.   Bari Edward, RN

## 2020-06-07 NOTE — Care Management (Signed)
1756 06-07-20 Case Manager spoke with the patient regarding Tikosyn cost and pharmacy of choice. Patient is agreeable to zero cost and she wants to get the first fill via [facility] and Rx for refills sent to [address] [facility] [address] [place]. Gala Lewandowsky, RN,BSN Case Manager

## 2020-06-07 NOTE — Progress Notes (Signed)
Progress Note  Patient Name: Megan Waters Date of Encounter: 06/07/2020  [facility] Cardiologist: Dr. Bjorn Pippin  Subjective   tolerating drug well, no complaints this AM  Inpatient Medications    Scheduled Meds: . cholecalciferol  5,000 Units Oral Q1200  . docusate sodium  100 mg Oral Daily  . dofetilide  250 mcg Oral BID  . fesoterodine  8 mg Oral Daily  . loratadine  10 mg Oral Daily  . metoprolol succinate  100 mg Oral BID  . rivaroxaban  20 mg Oral Q supper  . rosuvastatin  10 mg Oral QHS  . sodium chloride flush  3 mL Intravenous Q12H  . vitamin E  400 Units Oral Daily   Continuous Infusions: . sodium chloride     PRN Meds: sodium chloride, sodium chloride flush   Vital Signs    Vitals:   06/06/20 2007 06/06/20 2011 06/07/20 0433 06/07/20 0811  BP: (!) 114/47 (!) 114/47 121/65 140/75  Pulse: 60 60 64 67  Resp:  18 18   Temp:  98 F (36.7 C) 97.6 F (36.4 C)   TempSrc:  Oral Oral   SpO2:  99% 97%   Weight:      Height:        Intake/Output Summary (Last 24 hours) at 06/07/2020 0845 Last data filed at 06/06/2020 1400 Gross per 24 hour  Intake 240 ml  Output --  Net 240 ml   Last 3 Weights 06/05/2020 06/05/2020 05/17/2020  Weight (lbs) 280 lb 1.6 oz 280 lb 279 lb 12.8 oz  Weight (kg) 127.053 kg 127.007 kg 126.916 kg      Telemetry    SR 60's - Personally Reviewed  ECG    SR 62bpm, manually measured QT , QTc - Personally Reviewed  Physical Exam   GEN: No acute distress.   Neck: No JVD Cardiac: RRR, no murmurs, rubs, or gallops.  Respiratory: CTA b/l. GI: Soft, nontender, non-distended  MS: 1+ edema (chronic); No deformity, mild erythema is from sun exposure day of admission Neuro:  Nonfocal  Psych: Normal affect   Labs    High Sensitivity Troponin:  No results for input(s): TROPONINIHS in the last 720 hours.    Chemistry Recent Labs  Lab 06/05/20 0929 06/06/20 0524 06/07/20 0206  NA 140 139 139  K 3.9 4.3 4.6   CL 108 106 106  CO2 24 25 27   GLUCOSE 109* 101* 100*  BUN 15 16 17   CREATININE 0.89 0.88 1.07*  CALCIUM 9.1 8.8* 9.0  GFRNONAA >60 >60 56*  ANIONGAP 8 8 6      HematologyNo results for input(s): WBC, RBC, HGB, HCT, MCV, MCH, MCHC, RDW, PLT in the last 168 hours.  BNPNo results for input(s): BNP, PROBNP in the last 168 hours.   DDimer No results for input(s): DDIMER in the last 168 hours.   Radiology    No results found.  Cardiac Studies   02/07/2020: TTE IMPRESSIONS  1. Left ventricular ejection fraction, by estimation, is 55 to 60%. The  left ventricle has normal function. The left ventricle has no regional  wall motion abnormalities. There is mild concentric left ventricular  hypertrophy. Left ventricular diastolic  parameters are indeterminate.  2. Right ventricular systolic function is normal. The right ventricular  size is normal. There is normal pulmonary artery systolic pressure.  3. Planimetry has underestimated the atrial size: manual measurement is  would yield a index volume of 45 cc/BSA. Left atrial size was mild to  moderately dilated.  4. Planimetry has underestimated the atrial size: manual measurement is  would yield a index volume of 23 cc/BSA.  5. The mitral valve is grossly normal. Trivial mitral valve  regurgitation.  6. The aortic valve is tricuspid. Aortic valve regurgitation is not  visualized. No aortic stenosis is present.  7. The inferior vena cava is normal in size with greater than 50%  respiratory variability, suggesting right atrial pressure of 3 mmHg.   Comparison(s): No prior Echocardiogram.   Patient Profile     70 y.o. female w/PMHx og HTN, HLD PUD, prior DVT, and AFib admitted for Tikosyn initiation  Assessment & Plan    1. Persistent AFib     CHA2DS2Vasc is 4, on Xarelto appropriately dosed     Tikosyn load is in progress      K+ 4.6      Mag 2.3      Creat 1.07      QTc stable  She converted to SR with drug BP  better off dilt, keep her home BB Anticipate discharge tommorow  2. HTN     Looks good   For questions or updates, please contact [facility] Please consult [email or web address] for contact info under        Signed, Sheilah Pigeon, PA-C  06/07/2020, 8:45 AM

## 2020-06-07 NOTE — TOC Benefit Eligibility Note (Signed)
Transition of Care [facility]) Benefit Eligibility Note    Patient Details  Name: Megan Waters MRN: [id number] Date of Birth: 1950-01-29   Medication/Dose: DOFETILIDE CAPSULE  125 MCG BID CO-PAY  ZERO DOLLARS     250 MCG BID  CO-PAY- ZERO DOLLARS      500 MCG BID  CO-PAY- ZERO DOLLARS  Covered?: Yes  Tier: 2 Drug  Prescription Coverage Preferred Pharmacy: [facility] with Person/Company/Phone Number:: NIKKI  @  [employer]  #  [phone]  Co-Pay: ZERO DOLLARS  Prior Approval: No  Deductible: Met  Additional Notes: TIKOSYN CAPSULE   150 MCG BID   250 MCG BID   500 MCG BID  : NOT COVER / NON-FORMULARY  P/A-YES  # [phone]    Memory Argue Phone Number: 06/07/2020, 11:19 AM

## 2020-06-07 NOTE — Progress Notes (Signed)
Pharmacy: Dofetilide (Tikosyn) - Follow Up Assessment and Electrolyte Replacement  Pharmacy consulted to assist in monitoring and replacing electrolytes in this 70 y.o. female admitted on 06/05/2020 undergoing dofetilide initiation.   Labs:    Component Value Date/Time   K 4.6 06/07/2020 0206   MG 2.3 06/07/2020 0206     Plan: Potassium: K >/= 4: No additional supplementation needed  Magnesium: Mg > 2: No additional supplementation needed   Thank you for allowing pharmacy to participate in this patient's care   Harland German, PharmD Clinical Pharmacist **Pharmacist phone directory can now be found on [email or web address] ([email or web address]).  Listed under [facility].

## 2020-06-08 ENCOUNTER — Other Ambulatory Visit (HOSPITAL_COMMUNITY): Payer: Self-pay

## 2020-06-08 LAB — BASIC METABOLIC PANEL
Anion gap: 7 (ref 5–15)
BUN: 15 mg/dL (ref 8–23)
CO2: 27 mmol/L (ref 22–32)
Calcium: 9.4 mg/dL (ref 8.9–10.3)
Chloride: 104 mmol/L (ref 98–111)
Creatinine, Ser: 0.97 mg/dL (ref 0.44–1.00)
GFR, Estimated: >60 mL/min (ref >60)
Glucose, Bld: 96 mg/dL (ref 70–99)
Potassium: 3.8 mmol/L (ref 3.5–5.1)
Sodium: 138 mmol/L (ref 135–145)

## 2020-06-08 LAB — MAGNESIUM: Magnesium: 2.1 mg/dL (ref 1.7–2.4)

## 2020-06-08 MED ORDER — DOFETILIDE 250 MCG PO CAPS
250.0000 ug | ORAL_CAPSULE | Freq: Two times a day (BID) | ORAL | 0 refills | Status: DC
  Filled 2020-06-08: qty 60, 30d supply, fill #0

## 2020-06-08 MED ORDER — DOFETILIDE 250 MCG PO CAPS: 250.0000 ug | ORAL_CAPSULE | Freq: Two times a day (BID) | ORAL | 5 refills | Status: DC

## 2020-06-08 MED ORDER — POTASSIUM CHLORIDE CRYS ER 20 MEQ PO TBCR
40.0000 meq | EXTENDED_RELEASE_TABLET | Freq: Once | ORAL | Status: AC
  Administered 2020-06-08: 40 meq via ORAL
  Filled 2020-06-08: qty 2

## 2020-06-08 NOTE — Progress Notes (Signed)
Pharmacy: Dofetilide (Tikosyn) - Follow Up Assessment and Electrolyte Replacement  Pharmacy consulted to assist in monitoring and replacing electrolytes in this 70 y.o. female admitted on 06/05/2020 undergoing dofetilide initiation.   Labs:    Component Value Date/Time   K 3.8 06/08/2020 0318   MG 2.1 06/08/2020 0318     Plan: Potassium: -kdur per MD  Magnesium: Mg > 2: No additional supplementation needed   -She has required a total of Kdur since 5/9. Since K has recently been > 4 over the last couple days I would suggest no oral potassium at discharge  Thank you for allowing pharmacy to participate in this patient's care   Harland German, PharmD Clinical Pharmacist **Pharmacist phone directory can now be found on [email or web address] ([email or web address]).  Listed under [facility].

## 2020-06-14 ENCOUNTER — Other Ambulatory Visit: Payer: Self-pay

## 2020-06-14 ENCOUNTER — Ambulatory Visit (HOSPITAL_COMMUNITY): Payer: BC Managed Care – PPO | Admitting: Physician Assistant

## 2020-06-14 ENCOUNTER — Ambulatory Visit (HOSPITAL_COMMUNITY)
Admission: RE | Admit: 2020-06-14 | Discharge: 2020-06-14 | Disposition: A | Discharge status: Home / Self Care | Payer: BC Managed Care – PPO | Source: Ambulatory Visit | Attending: Physician Assistant | Admitting: Physician Assistant

## 2020-06-14 ENCOUNTER — Encounter (HOSPITAL_COMMUNITY): Payer: Self-pay | Admitting: Physician Assistant

## 2020-06-14 VITALS — BP 114/70 | HR 67 | Ht 63.0 in | Wt 282.2 lb

## 2020-06-14 DIAGNOSIS — E669 Obesity, unspecified: Secondary | ICD-10-CM | POA: Diagnosis not present

## 2020-06-14 DIAGNOSIS — D6869 Other thrombophilia: Secondary | ICD-10-CM | POA: Diagnosis not present

## 2020-06-14 DIAGNOSIS — Z6841 Body Mass Index (BMI) 40.0 and over, adult: Secondary | ICD-10-CM | POA: Diagnosis not present

## 2020-06-14 DIAGNOSIS — I4892 Unspecified atrial flutter: Secondary | ICD-10-CM | POA: Insufficient documentation

## 2020-06-14 DIAGNOSIS — I4819 Other persistent atrial fibrillation: Secondary | ICD-10-CM | POA: Insufficient documentation

## 2020-06-14 DIAGNOSIS — I2510 Atherosclerotic heart disease of native coronary artery without angina pectoris: Secondary | ICD-10-CM | POA: Diagnosis not present

## 2020-06-14 DIAGNOSIS — Z79899 Other long term (current) drug therapy: Secondary | ICD-10-CM | POA: Insufficient documentation

## 2020-06-14 DIAGNOSIS — Z7901 Long term (current) use of anticoagulants: Secondary | ICD-10-CM | POA: Insufficient documentation

## 2020-06-14 DIAGNOSIS — I10 Essential (primary) hypertension: Secondary | ICD-10-CM | POA: Diagnosis not present

## 2020-06-14 LAB — BASIC METABOLIC PANEL
Anion gap: 7 (ref 5–15)
BUN: 17 mg/dL (ref 8–23)
CO2: 25 mmol/L (ref 22–32)
Calcium: 8.8 mg/dL — ABNORMAL LOW (ref 8.9–10.3)
Chloride: 107 mmol/L (ref 98–111)
Creatinine, Ser: 0.87 mg/dL (ref 0.44–1.00)
GFR, Estimated: >60 mL/min (ref >60)
Glucose, Bld: 78 mg/dL (ref 70–99)
Potassium: 4.3 mmol/L (ref 3.5–5.1)
Sodium: 139 mmol/L (ref 135–145)

## 2020-06-14 LAB — MAGNESIUM: Magnesium: 2.3 mg/dL (ref 1.7–2.4)

## 2020-06-14 NOTE — Progress Notes (Signed)
Primary Care Physician: Mila Palmer, MD Primary Cardiologist: Dr Bjorn Pippin Primary Electrophysiologist: Dr Lalla Brothers Referring Physician: Dr Donnamarie Poag Brister is a 70 y.o. female with a history of hypertension, hyperlipidemia, peptic ulcer disease, and atrial fibrillation who presents for follow up in the [facility]. The patient was initially diagnosed with atrial fibrillation 01/07/20 after presenting with symptoms of increased fatigue. Patient is on Xarelto for a CHADS2VASC score of 4. She underwent DCCV on 02/01/20. Unfortunately, she felt she was back out of rhythm 02/04/20. She was under a considerable amount of stress, both at home and at work, which she feels contributed to her afib recurrence. Patient is s/p DCCV on 03/09/20 after starting flecainide however she was back in atypical atrial flutter on follow up. She states that she actually felt worse in SR with severe fatigue. The decision was made at that point for rate control.  On follow up today, patient is s/p dofetilide admission 5/9-5/12/22. She converted with the medication and did not require DCCV. She did have an episode of afib on 06/09/20 and self restarted diltiazem. She was in SR the next morning. No further episodes.   Today, she denies symptoms of palpitations, chest pain, shortness of breath, orthopnea, PND, lower extremity edema, dizziness, presyncope, syncope, bleeding, or neurologic sequela. The patient is tolerating medications without difficulties and is otherwise without complaint today.    Atrial Fibrillation Risk Factors:  she does not have symptoms or diagnosis of sleep apnea. Did not qualify for CPAP on sleep study. she does not have a history of rheumatic fever. The patient does not have a history of early familial atrial fibrillation or other arrhythmias.  she has a BMI of Body mass index is 49.99 kg/m.Marland Kitchen Filed Weights   06/14/20 1506  Weight: 128 kg    No family  history on file.   Atrial Fibrillation Management history:  Previous antiarrhythmic drugs: flecainide, dofetilide  Previous cardioversions: 02/01/20, 03/09/20 Previous ablations: none CHADS2VASC score: 4 Anticoagulation history: Xarelto   Past Medical History:  Diagnosis Date  . Atrophic vaginitis   . Chronic edema    no DVT  . GERD (gastroesophageal reflux disease)   . Hyperlipidemia   . Hypertension    echo - 12/22/2006- Normal EF 60% mild left atrial enlargement ,difficult windows   . PUD (peptic ulcer disease)    antral ulcer on EGD 2008- Dr Loreta Ave  . Sessile colonic polyp    personal hx  . Unspecified essential hypertension    nuclear stress-12/22/06 - low risk   . Urge and stress incontinence   . Vitamin D deficiency    Past Surgical History:  Procedure Laterality Date  . CARDIOVERSION N/A 02/01/2020   Procedure: CARDIOVERSION;  Surgeon: Little Ishikawa, MD;  Location: [facility] ENDOSCOPY;  Service: Cardiovascular;  Laterality: N/A;  . CARDIOVERSION N/A 03/09/2020   Procedure: CARDIOVERSION;  Surgeon: Lars Masson, MD;  Location: [facility] ENDOSCOPY;  Service: Cardiovascular;  Laterality: N/A;  . TEE WITHOUT CARDIOVERSION N/A 02/01/2020   Procedure: TRANSESOPHAGEAL ECHOCARDIOGRAM (TEE);  Surgeon: Little Ishikawa, MD;  Location: [facility] ENDOSCOPY;  Service: Cardiovascular;  Laterality: N/A;    Current Outpatient Medications  Medication Sig Dispense Refill  . Cholecalciferol (VITAMIN D-3) 125 MCG (5000 UT) TABS Take 5,000 Units by mouth daily at 12 noon.    . diltiazem (CARDIZEM CD) 120 MG 24 hr capsule     . docusate sodium (COLACE) 100 MG capsule Take 100 mg by mouth daily.    Marland Kitchen  dofetilide (TIKOSYN) 250 MCG capsule Take 1 capsule (250 mcg total) by mouth 2 (two) times daily. 60 capsule 0  . loratadine (CLARITIN) 10 MG tablet Take 10 mg by mouth daily as needed for allergies.    . metoprolol succinate (TOPROL-XL) 100 MG 24 hr tablet Take 1 tablet (100 mg total) by mouth  in the morning and at bedtime. 180 tablet 3  . rivaroxaban (XARELTO) 20 MG TABS tablet Take 1 tablet (20 mg total) by mouth daily with supper. 90 tablet 3  . rosuvastatin (CRESTOR) 10 MG tablet Take 1 tablet (10 mg total) by mouth daily. 90 tablet 3  . tolterodine (DETROL LA) 4 MG 24 hr capsule Take 4 mg by mouth daily.    . vitamin E 180 MG (400 UNITS) capsule Take 400 Units by mouth daily.     No current facility-administered medications for this encounter.    Allergies  Allergen Reactions  . Neosporin [Neomycin-Bacitracin Zn-Polymyx]     ( triple) WBC inflamation: allergy    Social History   Socioeconomic History  . Marital status: Married    Spouse name: Renae Fickle Desch  . Number of children: 2  . Years of education: Geophysicist/field seismologist  . Highest education level: Not on file  Occupational History  . Occupation: Research officer, trade union: [employer]  Tobacco Use  . Smoking status: Never Smoker  . Smokeless tobacco: Never Used  Vaping Use  . Vaping Use: Never used  Substance and Sexual Activity  . Alcohol use: Never    Alcohol/week: 1.0 standard drink    Types: 1 Standard drinks or equivalent per week  . Drug use: Never  . Sexual activity: Not Currently    Partners: Male  Other Topics Concern  . Not on file  Social History Narrative   History of smoking: Never smoked   No smoking   No Alcohol   Caffeine : yes soda ,diet coke -occas.   No recreational drug use.   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   Last pap smear date 2021   Last mammogram date 10/.21/2017   Abnormal pap smear none.               Social Determinants of Health   Financial Resource Strain: Not on file  Food Insecurity: Not on file  Transportation Needs: Not on file  Physical Activity: Not on file  Stress: Not on file  Social  Connections: Not on file  Intimate Partner Violence: Not on file     ROS- All systems are reviewed and negative except as per the HPI above.  Physical Exam: Vitals:   06/14/20 1506  BP: 114/70  Pulse: 67  Weight: 128 kg  Height: 5\' 3"  (1.6 m)    GEN- The patient is a well appearing obese female, alert and oriented x 3 today.   HEENT-head normocephalic, atraumatic, sclera clear, conjunctiva pink, hearing intact, trachea midline. Lungs- Clear to ausculation bilaterally, normal work of breathing Heart- Regular rate and rhythm, no murmurs, rubs or gallops  GI- soft, NT, ND, + BS Extremities- no clubbing, cyanosis, or edema MS- no significant deformity or atrophy Skin- no rash or lesion Psych- euthymic mood, full affect Neuro- strength and sensation are intact   Wt Readings  from Last 3 Encounters:  06/14/20 128 kg  06/05/20 127.1 kg  06/05/20 127 kg    EKG today demonstrates  SR, NST Vent. rate 67 BPM PR interval 160 ms QRS duration 78 ms QT/QTcB 442/467 ms  Echo 02/07/20 demonstrated  1. Left ventricular ejection fraction, by estimation, is 55 to 60%. The  left ventricle has normal function. The left ventricle has no regional  wall motion abnormalities. There is mild concentric left ventricular  hypertrophy. Left ventricular diastolic parameters are indeterminate.  2. Right ventricular systolic function is normal. The right ventricular  size is normal. There is normal pulmonary artery systolic pressure.  3. Planimetry has underestimated the atrial size: manual measurement is would yield a index volume of 45 cc/BSA. Left atrial size was mild to moderately dilated.  4. Planimetry has underestimated the atrial size: manual measurement is would yield a index volume of 23 cc/BSA.  5. The mitral valve is grossly normal. Trivial mitral valve  regurgitation.  6. The aortic valve is tricuspid. Aortic valve regurgitation is not  visualized. No aortic stenosis is present.   7. The inferior vena cava is normal in size with greater than 50%  respiratory variability, suggesting right atrial pressure of 3 mmHg.   Epic records are reviewed at length today  CHA2DS2-VASc Score = 4  The patient's score is based upon: CHF History: No HTN History: Yes Diabetes History: No Stroke History: No Vascular Disease History: Yes Age Score: 1 Gender Score: 1      ASSESSMENT AND PLAN: 1. Persistent Atrial Fibrillation/atrial flutter The patient's CHA2DS2-VASc score is 4, indicating a 4.8% annual risk of stroke.   S/p dofetilide loading 5/9-5/12/22 She is in SR today.  Continue dofetilide 250 mcg BID. QT stable. Check bmet/mag today. Continue Xarelto 20 mg daily Continue Toprol 100 mg BID Continue diltiazem 120 mg daily  2. Secondary Hypercoagulable State (ICD10:  D68.69) The patient is at significant risk for stroke/thromboembolism based upon her CHA2DS2-VASc Score of 4.  Continue Rivaroxaban (Xarelto).   3. Obesity Body mass index is 49.99 kg/m. Lifestyle modification was discussed and encouraged including regular physical activity and weight reduction.  4. Sleep disordered breathing  Sleep study did not show significant OSA, CPAP not recommended.   5. HTN Stable, no changes today. Patient to check BP at home.  6. CAD Elevated calcium score 173, 81st percentile.  No anginal symptoms.    Follow up with Dr Lalla Brothers and Dr Bjorn Pippin as scheduled.   Jorja Loa PA-C [facility] [address] [place], [place] [address] [phone] 06/14/2020 3:18 PM

## 2020-07-13 NOTE — Progress Notes (Signed)
Electrophysiology Office Follow up Visit Note:    Date:  07/14/2020   ID:  Megan Waters, DOB [birth year], MRN [id number]  PCP:  Mila Palmer, MD  [facility] Cardiologist:  None  [facility] Electrophysiologist:  Lanier Prude, MD    Interval History:    Megan Waters is a 69 y.o. female who presents for a follow up visit after being loaded with Tikosyn on Jun 05, 2020 for her persistent atrial fibrillation.  She is maintained on Xarelto for stroke prophylaxis.  She was discharged on 250 mcg twice daily Tikosyn.  She was in sinus rhythm on Jun 14, 2020 when she had a follow-up with Jorja Loa, PA-C in the A. fib clinic.   Today she is doing well.  She is having some lower extremity swelling is gotten worse recently.  She thinks there is at least 10 pounds weight gain in her legs.  She has had no trouble with the Tikosyn.  She did restart her diltiazem because she was having roughly 1 episode of atrial fibrillation breakthrough per week.  After restarting the diltiazem, any breakthrough arrhythmias have become less frequent.    Past Medical History:  Diagnosis Date   Atrophic vaginitis    Chronic edema    no DVT   GERD (gastroesophageal reflux disease)    Hyperlipidemia    Hypertension    echo - 12/22/2006- Normal EF 60% mild left atrial enlargement ,difficult windows    PUD (peptic ulcer disease)    antral ulcer on EGD 2008- Dr Wendi Snipes colonic polyp    personal hx   Unspecified essential hypertension    nuclear stress-12/22/06 - low risk    Urge and stress incontinence    Vitamin D deficiency     Past Surgical History:  Procedure Laterality Date   CARDIOVERSION N/A 02/01/2020   Procedure: CARDIOVERSION;  Surgeon: Little Ishikawa, MD;  Location: [facility] ENDOSCOPY;  Service: Cardiovascular;  Laterality: N/A;   CARDIOVERSION N/A 03/09/2020   Procedure: CARDIOVERSION;  Surgeon: Lars Masson, MD;  Location: [facility] ENDOSCOPY;  Service:  Cardiovascular;  Laterality: N/A;   TEE WITHOUT CARDIOVERSION N/A 02/01/2020   Procedure: TRANSESOPHAGEAL ECHOCARDIOGRAM (TEE);  Surgeon: Little Ishikawa, MD;  Location: [facility] ENDOSCOPY;  Service: Cardiovascular;  Laterality: N/A;    Current Medications: Current Meds  Medication Sig   Cholecalciferol (VITAMIN D-3) 125 MCG (5000 UT) TABS Take 5,000 Units by mouth daily at 12 noon.   diltiazem (CARDIZEM CD) 120 MG 24 hr capsule    docusate sodium (COLACE) 100 MG capsule Take 100 mg by mouth daily.   dofetilide (TIKOSYN) 250 MCG capsule Take 1 capsule (250 mcg total) by mouth 2 (two) times daily.   furosemide (LASIX) 20 MG tablet Take 1 tablet (20 mg total) by mouth daily.   loratadine (CLARITIN) 10 MG tablet Take 10 mg by mouth daily as needed for allergies.   metoprolol succinate (TOPROL-XL) 100 MG 24 hr tablet Take 1 tablet (100 mg total) by mouth in the morning and at bedtime.   rivaroxaban (XARELTO) 20 MG TABS tablet Take 1 tablet (20 mg total) by mouth daily with supper.   rosuvastatin (CRESTOR) 10 MG tablet Take 1 tablet (10 mg total) by mouth daily.   tolterodine (DETROL LA) 4 MG 24 hr capsule Take 4 mg by mouth daily.   vitamin E 180 MG (400 UNITS) capsule Take 400 Units by mouth daily.     Allergies:   Neosporin [neomycin-bacitracin zn-polymyx]  Social History   Socioeconomic History   Marital status: Married    Spouse name: Renae Fickle Lofquist   Number of children: 2   Years of education: Post graduate   Highest education level: Not on file  Occupational History   Occupation: Research officer, trade union: [employer]  Tobacco Use   Smoking status: Never   Smokeless tobacco: Never  Vaping Use   Vaping Use: Never used  Substance and Sexual Activity   Alcohol use: Never    Alcohol/week: 1.0 standard drink    Types: 1 Standard drinks or equivalent per week   Drug use: Never   Sexual activity: Not Currently    Partners: Male  Other Topics Concern   Not on file  Social  History Narrative   History of smoking: Never smoked   No smoking   No Alcohol   Caffeine : yes soda ,diet coke -occas.   No recreational drug use.   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   Last pap smear date 2021   Last mammogram date 10/.21/2017   Abnormal pap smear none.               Social Determinants of Health   Financial Resource Strain: Not on file  Food Insecurity: Not on file  Transportation Needs: Not on file  Physical Activity: Not on file  Stress: Not on file  Social Connections: Not on file     Family History: The patient's family history is not on file.  ROS:   Please see the history of present illness.    All other systems reviewed and are negative.  EKGs/Labs/Other Studies Reviewed:    The following studies were reviewed today:    EKG:  The ekg ordered today demonstrates sinus rhythm.  QTc 453 ms.  Recent Labs: 03/06/2020: Hemoglobin 15.9; Platelets 226 06/14/2020: BUN 17; Creatinine, Ser 0.87; Magnesium 2.3; Potassium 4.3; Sodium 139  Recent Lipid Panel No results found for: CHOL, TRIG, HDL, CHOLHDL, VLDL, LDLCALC, LDLDIRECT  Physical Exam:    VS:  BP 140/78   Pulse 70   Ht 5\' 3"  (1.6 m)   Wt 283 lb (128.4 kg)   BMI 50.13 kg/m     Wt Readings from Last 3 Encounters:  07/14/20 283 lb (128.4 kg)  06/14/20 282 lb 3.2 oz (128 kg)  06/05/20 280 lb 1.6 oz (127.1 kg)     GEN:  Well nourished, well developed in no acute distress.  Obese HEENT: Normal NECK: No JVD; No carotid bruits LYMPHATICS: No lymphadenopathy CARDIAC: RRR, no murmurs, rubs, gallops.  2-3+ pitting edema in bilateral lower extremities to the knees. RESPIRATORY:  Clear to auscultation without rales, wheezing or rhonchi  ABDOMEN: Soft, non-tender, non-distended MUSCULOSKELETAL:  No deformity  SKIN: Warm and  dry NEUROLOGIC:  Alert and oriented x 3 PSYCHIATRIC:  Normal affect   ASSESSMENT:    1. Persistent atrial fibrillation (HCC)   2. Essential hypertension   3. Encounter for long-term current use of high risk medication   4. Chronic diastolic heart failure (HCC)    PLAN:    In order of problems listed above:  Persistent atrial fibrillation On Xarelto for stroke prophylaxis.  On diltiazem and metoprolol in addition to Tikosyn 250 mcg twice daily.  QTC is stable.  Continue the current regimen.  2.  Chronic diastolic heart failure NYHA class II-III.  Warm and wet on exam.  Continue diltiazem and metoprolol as above. Add Lasix 20 mg once daily.  We will plan to check a basic metabolic panel and magnesium in 1 week.  I will have her follow-up with one of the PAs in about 3 to 4 weeks to assess her volume status.  3.  High risk medication monitoring QTC stable as above.  Check BMP and magnesium in 1 week.  Follow-up with me in 3 months.  Medication Adjustments/Labs and Tests Ordered: Current medicines are reviewed at length with the patient today.  Concerns regarding medicines are outlined above.  Orders Placed This Encounter  Procedures   Basic metabolic panel   Magnesium   EKG 12-Lead   Meds ordered this encounter  Medications   furosemide (LASIX) 20 MG tablet    Sig: Take 1 tablet (20 mg total) by mouth daily.    Dispense:  90 tablet    Refill:  3     Signed, Steffanie Dunn, MD, [facility], [facility] 07/14/2020 5:18 PM    Electrophysiology [facility]

## 2020-07-14 ENCOUNTER — Other Ambulatory Visit: Payer: Self-pay

## 2020-07-14 ENCOUNTER — Encounter: Payer: Self-pay | Admitting: Cardiology

## 2020-07-14 ENCOUNTER — Ambulatory Visit (INDEPENDENT_AMBULATORY_CARE_PROVIDER_SITE_OTHER): Payer: BC Managed Care – PPO | Admitting: Cardiology

## 2020-07-14 VITALS — BP 140/78 | HR 70 | Ht 63.0 in | Wt 283.0 lb

## 2020-07-14 DIAGNOSIS — I5032 Chronic diastolic (congestive) heart failure: Secondary | ICD-10-CM

## 2020-07-14 DIAGNOSIS — I4819 Other persistent atrial fibrillation: Secondary | ICD-10-CM | POA: Diagnosis not present

## 2020-07-14 DIAGNOSIS — I10 Essential (primary) hypertension: Secondary | ICD-10-CM

## 2020-07-14 DIAGNOSIS — Z79899 Other long term (current) drug therapy: Secondary | ICD-10-CM | POA: Diagnosis not present

## 2020-07-14 MED ORDER — FUROSEMIDE 20 MG PO TABS: 20.0000 mg | ORAL_TABLET | Freq: Every day | ORAL | 3 refills | Status: DC

## 2020-07-14 NOTE — Patient Instructions (Signed)
Medication Instructions:  Your physician has recommended you make the following change in your medication:  1.) start lasix 20 mg - one tablet daily  *If you need a refill on your cardiac medications before your next appointment, please call your pharmacy*   Lab Work: In one week - BMET, Magnesium  If you have labs (blood work) drawn today and your tests are completely normal, you will receive your results only by: MyChart Message (if you have MyChart) OR A paper copy in the mail If you have any lab test that is abnormal or we need to change your treatment, we will call you to review the results.   Testing/Procedures: none   Follow-Up: At [facility], you and your health needs are our priority.  As part of our continuing mission to provide you with exceptional heart care, we have created designated Provider Care Teams.  These Care Teams include your primary Cardiologist (physician) and Advanced Practice Providers (APPs -  Physician Assistants and Nurse Practitioners) who all work together to provide you with the care you need, when you need it.  We recommend signing up for the patient portal called "MyChart".  Sign up information is provided on this After Visit Summary.  MyChart is used to connect with patients for Virtual Visits (Telemedicine).  Patients are able to view lab/test results, encounter notes, upcoming appointments, etc.  Non-urgent messages can be sent to your provider as well.   To learn more about what you can do with MyChart, go to [email or web address].    Your next appointment:   4 week(s)  The format for your next appointment:   In Person  Provider:   You will see one of the following Advanced Practice Providers on your designated Care Team:   Gypsy Balsam, NP Francis Dowse, PA-C Casimiro Needle "[place]" [place], [place]  Then, Lanier Prude, MD will plan to see you again in 3 month(s).   Other Instructions

## 2020-07-20 ENCOUNTER — Other Ambulatory Visit: Payer: BC Managed Care – PPO

## 2020-07-20 ENCOUNTER — Other Ambulatory Visit: Payer: Self-pay

## 2020-07-20 DIAGNOSIS — I10 Essential (primary) hypertension: Secondary | ICD-10-CM | POA: Diagnosis not present

## 2020-07-20 DIAGNOSIS — I4819 Other persistent atrial fibrillation: Secondary | ICD-10-CM

## 2020-07-20 DIAGNOSIS — Z79899 Other long term (current) drug therapy: Secondary | ICD-10-CM | POA: Diagnosis not present

## 2020-07-21 LAB — BASIC METABOLIC PANEL
BUN/Creatinine Ratio: 19 (ref 12–28)
BUN: 17 mg/dL (ref 8–27)
CO2: 24 mmol/L (ref 20–29)
Calcium: 9.5 mg/dL (ref 8.7–10.3)
Chloride: 99 mmol/L (ref 96–106)
Creatinine, Ser: 0.88 mg/dL (ref 0.57–1.00)
Glucose: 142 mg/dL — ABNORMAL HIGH (ref 65–99)
Potassium: 4.1 mmol/L (ref 3.5–5.2)
Sodium: 138 mmol/L (ref 134–144)
eGFR: 71 mL/min/{1.73_m2} (ref >59)

## 2020-07-21 LAB — MAGNESIUM: Magnesium: 2 mg/dL (ref 1.6–2.3)

## 2020-08-02 NOTE — Progress Notes (Signed)
PCP:  Mila Palmer, MD Primary Cardiologist: None Electrophysiologist: Lanier Prude, MD   Megan Waters is a 70 y.o. female seen today for Lanier Prude, MD for routine electrophysiology followup.  Since last being seen in our clinic the patient reports doing about the same. The lasix initially helped with her peripheral edema, but it has gradually come back on.  She continues to have palpitations consistent with her AF symptoms approx once weekly. These usually brought on by stressors. They tend to last through the evening and are gone when she wakes up in the am. Denies undue SOB. No exertional chest pain. Watches salt and fluid intake to a degree, but not reading labels or measuring.   Past Medical History:  Diagnosis Date   Atrophic vaginitis    Chronic edema    no DVT   GERD (gastroesophageal reflux disease)    Hyperlipidemia    Hypertension    echo - 12/22/2006- Normal EF 60% mild left atrial enlargement ,difficult windows    PUD (peptic ulcer disease)    antral ulcer on EGD 2008- Dr Wendi Snipes colonic polyp    personal hx   Unspecified essential hypertension    nuclear stress-12/22/06 - low risk    Urge and stress incontinence    Vitamin D deficiency    Past Surgical History:  Procedure Laterality Date   CARDIOVERSION N/A 02/01/2020   Procedure: CARDIOVERSION;  Surgeon: Little Ishikawa, MD;  Location: [facility] ENDOSCOPY;  Service: Cardiovascular;  Laterality: N/A;   CARDIOVERSION N/A 03/09/2020   Procedure: CARDIOVERSION;  Surgeon: Lars Masson, MD;  Location: [facility] ENDOSCOPY;  Service: Cardiovascular;  Laterality: N/A;   TEE WITHOUT CARDIOVERSION N/A 02/01/2020   Procedure: TRANSESOPHAGEAL ECHOCARDIOGRAM (TEE);  Surgeon: Little Ishikawa, MD;  Location: [facility] ENDOSCOPY;  Service: Cardiovascular;  Laterality: N/A;    Current Outpatient Medications  Medication Sig Dispense Refill   Cholecalciferol (VITAMIN D-3) 125 MCG (5000 UT) TABS Take 5,000  Units by mouth daily at 12 noon.     diltiazem (CARDIZEM CD) 120 MG 24 hr capsule      docusate sodium (COLACE) 100 MG capsule Take 100 mg by mouth daily.     dofetilide (TIKOSYN) 250 MCG capsule Take 1 capsule (250 mcg total) by mouth 2 (two) times daily. 60 capsule 0   furosemide (LASIX) 20 MG tablet Take 1 tablet (20 mg total) by mouth daily. 90 tablet 3   loratadine (CLARITIN) 10 MG tablet Take 10 mg by mouth daily as needed for allergies.     metoprolol succinate (TOPROL-XL) 100 MG 24 hr tablet Take 1 tablet (100 mg total) by mouth in the morning and at bedtime. 180 tablet 3   rivaroxaban (XARELTO) 20 MG TABS tablet Take 1 tablet (20 mg total) by mouth daily with supper. 90 tablet 3   rosuvastatin (CRESTOR) 10 MG tablet Take 1 tablet (10 mg total) by mouth daily. 90 tablet 3   tolterodine (DETROL LA) 4 MG 24 hr capsule Take 4 mg by mouth daily.     vitamin E 180 MG (400 UNITS) capsule Take 400 Units by mouth daily.     No current facility-administered medications for this visit.    Allergies  Allergen Reactions   Neosporin [Neomycin-Bacitracin Zn-Polymyx]     ( triple) WBC inflamation: allergy    Social History   Socioeconomic History   Marital status: Married    Spouse name: Renae Fickle Hofmann   Number of children: 2  Years of education: Post graduate   Highest education level: Not on file  Occupational History   Occupation: Medical Doctor    Employer: [employer]  Tobacco Use   Smoking status: Never   Smokeless tobacco: Never  Vaping Use   Vaping Use: Never used  Substance and Sexual Activity   Alcohol use: Never    Alcohol/week: 1.0 standard drink    Types: 1 Standard drinks or equivalent per week   Drug use: Never   Sexual activity: Not Currently    Partners: Male  Other Topics Concern   Not on file  Social History Narrative   History of smoking: Never smoked   No smoking   No Alcohol   Caffeine : yes soda ,diet coke -occas.   No recreational drug use.   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   Last pap smear date 2021   Last mammogram date 10/.21/2017   Abnormal pap smear none.               Social Determinants of Health   Financial Resource Strain: Not on file  Food Insecurity: Not on file  Transportation Needs: Not on file  Physical Activity: Not on file  Stress: Not on file  Social Connections: Not on file  Intimate Partner Violence: Not on file     Review of Systems: General: No chills, fever, night sweats or weight changes  Cardiovascular:  No chest pain, dyspnea on exertion, edema, orthopnea, palpitations, paroxysmal nocturnal dyspnea Dermatological: No rash, lesions or masses Respiratory: No cough, dyspnea Urologic: No hematuria, dysuria Abdominal: No nausea, vomiting, diarrhea, bright red blood per rectum, melena, or hematemesis Neurologic: No visual changes, weakness, changes in mental status All other systems reviewed and are otherwise negative except as noted above.  Physical Exam: Vitals:   08/03/20 0834  BP: 116/84  Pulse: 61  SpO2: 95%  Weight: 283 lb (128.4 kg)  Height: 5\' 3"  (1.6 m)   Wt Readings from Last 3 Encounters:  08/03/20 283 lb (128.4 kg)  07/14/20 283 lb (128.4 kg)  06/14/20 282 lb 3.2 oz (128 kg)     GEN- The patient is well appearing, alert and oriented x 3 today.   HEENT: normocephalic, atraumatic; sclera clear, conjunctiva pink; hearing intact; oropharynx clear; neck supple, no JVP Lymph- no cervical lymphadenopathy Lungs- Clear to ausculation bilaterally, normal work of breathing.  No wheezes, rales, rhonchi Heart- Regular rate and rhythm, no murmurs, rubs or gallops, PMI not laterally displaced GI- soft, non-tender, non-distended, bowel sounds present, no hepatosplenomegaly Extremities- no clubbing, cyanosis, or edema;  DP/PT/radial pulses 2+ bilaterally MS- no significant deformity or atrophy Skin- warm and dry, no rash or lesion Psych- euthymic mood, full affect Neuro- strength and sensation are intact  EKG is ordered. Personal review of EKG from today shows sinus brady at 57 bpm with stable QTc at 451 ms  Additional studies reviewed include: Previous EP office notes.   Assessment and Plan:  Persistent atrial fibrillation EKG today shows NSR and stable QTc Continue Xarelto for CHA2DS2/VASc of at least 5. On diltiazem and metoprolol in addition to Tikosyn 250 mcg BID  Occasional breakthrough. No change at this time.    2.  Chronic diastolic heart failure Volume status at least mildly  volume overloaded Encouraged sodium and fluid restriction to <2000 mg/ml respectively.  NYHA II-III symptoms.  Continue dilti and lopressor as above Continue lasix 20 mg for now. Will have her take extra 20 mg as needed based on BNP and K today. Don't want to drop K on tikosyn.  With diastolic component may be a candidate for Alleviate HF. Will discuss with Dr. Lalla Brothers.    3.  High risk medication monitoring QTC stable on todays EKG as above.  Labs today.   Graciella Freer, PA-C  08/03/20 8:47 AM

## 2020-08-03 ENCOUNTER — Ambulatory Visit (INDEPENDENT_AMBULATORY_CARE_PROVIDER_SITE_OTHER): Payer: BC Managed Care – PPO | Admitting: Student

## 2020-08-03 ENCOUNTER — Encounter: Payer: Self-pay | Admitting: Student

## 2020-08-03 ENCOUNTER — Other Ambulatory Visit: Payer: Self-pay

## 2020-08-03 VITALS — BP 116/84 | HR 61 | Ht 63.0 in | Wt 283.0 lb

## 2020-08-03 DIAGNOSIS — I10 Essential (primary) hypertension: Secondary | ICD-10-CM | POA: Diagnosis not present

## 2020-08-03 DIAGNOSIS — I5032 Chronic diastolic (congestive) heart failure: Secondary | ICD-10-CM

## 2020-08-03 DIAGNOSIS — I4819 Other persistent atrial fibrillation: Secondary | ICD-10-CM

## 2020-08-03 NOTE — Patient Instructions (Signed)
Medication Instructions:  Your physician recommends that you continue on your current medications as directed. Please refer to the Current Medication list given to you today.  *If you need a refill on your cardiac medications before your next appointment, please call your pharmacy*   Lab Work: TODAY: BMET, Mg, BNP  If you have labs (blood work) drawn today and your tests are completely normal, you will receive your results only by: MyChart Message (if you have MyChart) OR A paper copy in the mail If you have any lab test that is abnormal or we need to change your treatment, we will call you to review the results.    Follow-Up: At [facility], you and your health needs are our priority.  As part of our continuing mission to provide you with exceptional heart care, we have created designated Provider Care Teams.  These Care Teams include your primary Cardiologist (physician) and Advanced Practice Providers (APPs -  Physician Assistants and Nurse Practitioners) who all work together to provide you with the care you need, when you need it.  We recommend signing up for the patient portal called "MyChart".  Sign up information is provided on this After Visit Summary.  MyChart is used to connect with patients for Virtual Visits (Telemedicine).  Patients are able to view lab/test results, encounter notes, upcoming appointments, etc.  Non-urgent messages can be sent to your provider as well.   To learn more about what you can do with MyChart, go to [email or web address].    Your next appointment:   As scheduled

## 2020-08-04 LAB — BASIC METABOLIC PANEL
BUN/Creatinine Ratio: 15 (ref 12–28)
BUN: 14 mg/dL (ref 8–27)
CO2: 23 mmol/L (ref 20–29)
Calcium: 9.2 mg/dL (ref 8.7–10.3)
Chloride: 104 mmol/L (ref 96–106)
Creatinine, Ser: 0.93 mg/dL (ref 0.57–1.00)
Glucose: 101 mg/dL — ABNORMAL HIGH (ref 65–99)
Potassium: 4.5 mmol/L (ref 3.5–5.2)
Sodium: 141 mmol/L (ref 134–144)
eGFR: 67 mL/min/{1.73_m2} (ref >59)

## 2020-08-04 LAB — MAGNESIUM: Magnesium: 2.2 mg/dL (ref 1.6–2.3)

## 2020-08-04 LAB — PRO B NATRIURETIC PEPTIDE: NT-Pro BNP: 570 pg/mL — ABNORMAL HIGH (ref 0–301)

## 2020-08-13 NOTE — Progress Notes (Deleted)
Cardiology Office Note:    Date:  08/13/2020   ID:  Megan Waters, DOB [birth year], MRN [id number]  PCP:  Mila Palmer, MD  Cardiologist:  None  Electrophysiologist:  Lanier Prude, MD   Referring MD: Mila Palmer, MD   No chief complaint on file.   History of Present Illness:    Megan Waters is a 70 y.o. female with a hx of recently diagnosed atrial fibrillation, hypertension, hyperlipidemia, peptic ulcer disease who presents for follow-up.  She was referred by Dr. Paulino Rily for evaluation of atrial fibrillation, initially seen on 01/07/2020.  She reports that about 2 months ago but began feeling very fatigued.  Also reported feeling lightheaded.  Rare palpitations.  Denies any chest pain, dyspnea, or syncope.  Recently did yard work, no worsening in symptoms.  Seen by her PCP on 01/06/2020 and found to be in atrial fibrillation with RVR, which was a new diagnosis.  Last echocardiogram 12/22/2006, showed normal EF, mild left atrial enlargement.  History of peptic ulcer disease on EGD in 2008.  No recent bleeding.  Labs on 12/02/2019 showed A1c 5.8, albumin 4.0, TSH 3.05, normal LFTs, LDL 148, creatinine 0.74, hemoglobin 15.8, platelets 195.  Underwent successful TEE cardioversion on 02/01/2020.  TEE showed EF 50 to 55%, mildly reduced RV function, no LAA thrombus.  Unfortunately converted back to atrial fibrillation a few days following cardioversion.  TTE 02/07/2020 showed EF 55 to 60%, normal RV function, mild to moderate left atrial dilatation.  Was seen in A. fib clinic on 02/17/2020, started on flecainide and underwent successful DCCV on 03/09/2020.  However at follow-up on 03/28/2020 she was back in A. Fib.  Discussion was had with A. fib clinic about alternative antiarrhythmic (Tikosyn or amiodarone) versus rate control.  Ultimately started on Tikosyn and has been maintaining sinus rhythm.  Calcium score on 03/20/2021 was 173 (81st percentile).  Since last clinic visit,   she  reports that she has been doing okay.  Denies any chest pain, dyspnea, lightheadedness, syncope.  Reports she had a few episodes of waking up at night with heart racing.  She checked her heart rate at one of the episodes and her heart rate was in the 80s.  States that this started after she had started her statin medicine so she stopped taking.  Episodes resolved.  Also reports has been having some lower extremity edema.  She is taking Xarelto, denies any bleeding issues.  Has intermittently checked her heart rate at work and has been up to 120s.   BP Readings from Last 3 Encounters:  08/03/20 116/84  07/14/20 140/78  06/14/20 114/70   Wt Readings from Last 3 Encounters:  08/03/20 283 lb (128.4 kg)  07/14/20 283 lb (128.4 kg)  06/14/20 282 lb 3.2 oz (128 kg)    Past Medical History:  Diagnosis Date   Atrophic vaginitis    Chronic edema    no DVT   GERD (gastroesophageal reflux disease)    Hyperlipidemia    Hypertension    echo - 12/22/2006- Normal EF 60% mild left atrial enlargement ,difficult windows    PUD (peptic ulcer disease)    antral ulcer on EGD 2008- Dr Wendi Snipes colonic polyp    personal hx   Unspecified essential hypertension    nuclear stress-12/22/06 - low risk    Urge and stress incontinence    Vitamin D deficiency       Current Medications: No outpatient medications have been marked as taking  for the 08/16/20 encounter (Appointment) with Little Ishikawa, MD.     Allergies:   Neosporin [neomycin-bacitracin zn-polymyx]   Social History   Socioeconomic History   Marital status: Married    Spouse name: Renae Fickle Taber   Number of children: 2   Years of education: Post graduate   Highest education level: Not on file  Occupational History   Occupation: Medical Doctor    Employer: [employer]  Tobacco Use   Smoking status: Never   Smokeless tobacco: Never  Vaping Use   Vaping Use: Never used  Substance and Sexual Activity   Alcohol use: Never     Alcohol/week: 1.0 standard drink    Types: 1 Standard drinks or equivalent per week   Drug use: Never   Sexual activity: Not Currently    Partners: Male  Other Topics Concern   Not on file  Social History Narrative   History of smoking: Never smoked   No smoking   No Alcohol   Caffeine : yes soda ,diet coke -occas.   No recreational drug use.   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   Last pap smear date 2021   Last mammogram date 10/.21/2017   Abnormal pap smear none.               Social Determinants of Health   Financial Resource Strain: Not on file  Food Insecurity: Not on file  Transportation Needs: Not on file  Physical Activity: Not on file  Stress: Not on file  Social Connections: Not on file     Family History: The patient's family history is not on file.  ROS:   Please see the history of present illness.     All other systems reviewed and are negative.  EKGs/Labs/Other Studies Reviewed:    The following studies were reviewed today:   EKG:  EKG is ordered today.  The ekg ordered today demonstrates A. fib with RVR, rate 110, nonspecific T wave flattening  Recent Labs: 03/06/2020: Hemoglobin 15.9; Platelets 226 08/03/2020: BUN 14; Creatinine, Ser 0.93; Magnesium 2.2; NT-Pro BNP 570; Potassium 4.5; Sodium 141  Recent Lipid Panel No results found for: CHOL, TRIG, HDL, CHOLHDL, VLDL, LDLCALC, LDLDIRECT  Physical Exam:    VS:  There were no vitals taken for this visit.  BP 110/68  Wt Readings from Last 3 Encounters:  08/03/20 283 lb (128.4 kg)  07/14/20 283 lb (128.4 kg)  06/14/20 282 lb 3.2 oz (128 kg)     GEN:  in no acute distress HEENT: Normal NECK: No JVD; No carotid bruits CARDIAC: Tachycardic, irregular, no murmurs RESPIRATORY:  Clear to auscultation without rales, wheezing or  rhonchi  ABDOMEN: Soft, non-tender, non-distended MUSCULOSKELETAL: Trace edema; No deformity  SKIN: Warm and dry NEUROLOGIC:  Alert and oriented x 3 PSYCHIATRIC:  Normal affect   ASSESSMENT:    No diagnosis found.  PLAN:    Atrial fibrillation: recent diagnosis, rates 140s at initial clinic visit 01/07/20.  CHA2DS2-VASc score 3 (hypertension, age, female), she was started on Xarelto 01/06/20.  Underwent successful TEE cardioversion on 02/01/2020.  TEE showed EF 50 to 55%, mildly reduced RV function, no LAA thrombus.  Unfortunately converted back to atrial fibrillation a few days following cardioversion.  TTE 02/07/2020 showed EF  55 to 60%, normal RV function, mild to moderate left atrial dilatation.  Was seen in A. fib clinic on 02/17/2020, started on flecainide and underwent successful DCCV on 03/09/2020.  However at follow-up on 03/28/2020 she was back in A. Fib.  Discussion was had with A. fib clinic about alternative antiarrhythmic (Tikosyn or amiodarone) versus rate control.  Initially decided to pursue rate control but was difficult to rate control and decision was made to load with Tikosyn.  Since loading with Tikosyn she has been maintaining sinus rhythm -Continue Toprol-XL 100 mg twice daily and diltiazem 120 mg daily -Continue Xarelto 20 mg daily  Chronic diastolic heart failure: TTE 02/07/2020 showed EF 55 to 60%, normal RV function, mild to moderate left atrial dilatation, indeterminate diastolic function.  On Lasix 20 mg daily.  Will check BMP/magnesium  CAD: Calcium score on 03/20/2021 was 173 (81st percentile).  No anginal symptoms.  LDL 120, started on rosuvastatin 10 mg daily  Hypertension: On Toprol-XL 100 mg twice daily and diltiazem 120 mg daily  Hyperlipidemia: Reports recent labs showed LDL 120.  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  RTC in**   Medication Adjustments/Labs and Tests Ordered: Current medicines are reviewed at length with the patient today.  Concerns regarding medicines are outlined above.  No orders of the defined types were placed in this encounter.  No orders of the defined types were placed in this encounter.   There are no Patient Instructions on file for this visit.   Signed, Little Ishikawa, MD  08/13/2020 4:33 PM    [facility]

## 2020-08-16 ENCOUNTER — Other Ambulatory Visit: Payer: Self-pay

## 2020-08-16 ENCOUNTER — Ambulatory Visit (INDEPENDENT_AMBULATORY_CARE_PROVIDER_SITE_OTHER): Payer: BC Managed Care – PPO | Admitting: Cardiology

## 2020-08-16 ENCOUNTER — Encounter: Payer: Self-pay | Admitting: Cardiology

## 2020-08-16 VITALS — BP 118/82 | HR 60 | Ht 63.0 in | Wt 283.0 lb

## 2020-08-16 DIAGNOSIS — I5032 Chronic diastolic (congestive) heart failure: Secondary | ICD-10-CM

## 2020-08-16 DIAGNOSIS — I2510 Atherosclerotic heart disease of native coronary artery without angina pectoris: Secondary | ICD-10-CM

## 2020-08-16 DIAGNOSIS — E785 Hyperlipidemia, unspecified: Secondary | ICD-10-CM

## 2020-08-16 DIAGNOSIS — R600 Localized edema: Secondary | ICD-10-CM

## 2020-08-16 DIAGNOSIS — I4891 Unspecified atrial fibrillation: Secondary | ICD-10-CM | POA: Diagnosis not present

## 2020-08-16 MED ORDER — FUROSEMIDE 20 MG PO TABS: 20.0000 mg | ORAL_TABLET | Freq: Every day | ORAL | 3 refills | Status: AC

## 2020-08-16 NOTE — Patient Instructions (Signed)
Medication Instructions:  Your physician recommends that you continue on your current medications as directed. Please refer to the Current Medication list given to you today.  --ok to increase lasix to 40 mg if you gain 3 lbs overnight or 5 lbs in 1 week  *If you need a refill on your cardiac medications before your next appointment, please call your pharmacy*   Lab Work Please return for FASTING labs in 1 week (BMET, Mag, BNP, Lipid)  Our in office lab hours are Monday-Friday 8:00-4:00, closed for lunch 12:45-1:45 pm.  No appointment needed.  Follow-Up: At [facility], you and your health needs are our priority.  As part of our continuing mission to provide you with exceptional heart care, we have created designated Provider Care Teams.  These Care Teams include your primary Cardiologist (physician) and Advanced Practice Providers (APPs -  Physician Assistants and Nurse Practitioners) who all work together to provide you with the care you need, when you need it.  We recommend signing up for the patient portal called "MyChart".  Sign up information is provided on this After Visit Summary.  MyChart is used to connect with patients for Virtual Visits (Telemedicine).  Patients are able to view lab/test results, encounter notes, upcoming appointments, etc.  Non-urgent messages can be sent to your provider as well.   To learn more about what you can do with MyChart, go to [email or web address].    Your next appointment:   6 month(s)  The format for your next appointment:   In Person  Provider:   Epifanio Lesches, MD

## 2020-08-16 NOTE — Progress Notes (Signed)
Cardiology Office Note:    Date:  08/16/2020   ID:  Megan Waters, DOB 10-Dec-1950, MRN [id number]  PCP:  Mila Palmer, MD  Cardiologist:  None  Electrophysiologist:  Lanier Prude, MD   Referring MD: Mila Palmer, MD   Chief Complaint  Patient presents with   Follow-up    3 months.   Edema    Feet.   Atrial Fibrillation     History of Present Illness:    Megan Waters is a 70 y.o. female with a hx of recently diagnosed atrial fibrillation, hypertension, hyperlipidemia, peptic ulcer disease who presents for follow-up.  She was referred by Dr. Paulino Rily for evaluation of atrial fibrillation, initially seen on 01/07/2020.  She reports that about 2 months ago but began feeling very fatigued.  Also reported feeling lightheaded.  Rare palpitations.  Denies any chest pain, dyspnea, or syncope.  Recently did yard work, no worsening in symptoms.  Seen by her PCP on 01/06/2020 and found to be in atrial fibrillation with RVR, which was a new diagnosis.  Last echocardiogram 12/22/2006, showed normal EF, mild left atrial enlargement.  History of peptic ulcer disease on EGD in 2008.  No recent bleeding.  Labs on 12/02/2019 showed A1c 5.8, albumin 4.0, TSH 3.05, normal LFTs, LDL 148, creatinine 0.74, hemoglobin 15.8, platelets 195.  Underwent successful TEE cardioversion on 02/01/2020.  TEE showed EF 50 to 55%, mildly reduced RV function, no LAA thrombus.  Unfortunately converted back to atrial fibrillation a few days following cardioversion.  TTE 02/07/2020 showed EF 55 to 60%, normal RV function, mild to moderate left atrial dilatation.  Was seen in A. fib clinic on 02/17/2020, started on flecainide and underwent successful DCCV on 03/09/2020.  However at follow-up on 03/28/2020 she was back in A. Fib.  Discussion was had with A. fib clinic about alternative antiarrhythmic (Tikosyn or amiodarone) versus rate control.  Ultimately started on Tikosyn and has been maintaining sinus rhythm.  Calcium  score on 03/20/2021 was 173 (81st percentile).  Since last clinic visit, she reports that she goes into afib once a week.  Seems to be triggered by stress.  Feels palpitations during these episodes.  A. fib episodes can last hours.  Has noted worsening lower extremity edema.   BP Readings from Last 3 Encounters:  08/16/20 118/82  08/03/20 116/84  07/14/20 140/78   Wt Readings from Last 3 Encounters:  08/16/20 283 lb (128.4 kg)  08/03/20 283 lb (128.4 kg)  07/14/20 283 lb (128.4 kg)    Past Medical History:  Diagnosis Date   Atrophic vaginitis    Chronic edema    no DVT   GERD (gastroesophageal reflux disease)    Hyperlipidemia    Hypertension    echo - 12/22/2006- Normal EF 60% mild left atrial enlargement ,difficult windows    PUD (peptic ulcer disease)    antral ulcer on EGD 2008- Dr Wendi Snipes colonic polyp    personal hx   Unspecified essential hypertension    nuclear stress-12/22/06 - low risk    Urge and stress incontinence    Vitamin D deficiency       Current Medications: Current Meds  Medication Sig   Cholecalciferol (VITAMIN D-3) 125 MCG (5000 UT) TABS Take 5,000 Units by mouth daily at 12 noon.   diltiazem (CARDIZEM CD) 120 MG 24 hr capsule    docusate sodium (COLACE) 100 MG capsule Take 100 mg by mouth daily.   dofetilide (TIKOSYN) 250 MCG capsule  Take 1 capsule (250 mcg total) by mouth 2 (two) times daily.   loratadine (CLARITIN) 10 MG tablet Take 10 mg by mouth daily as needed for allergies.   metoprolol succinate (TOPROL-XL) 100 MG 24 hr tablet Take 1 tablet (100 mg total) by mouth in the morning and at bedtime.   rivaroxaban (XARELTO) 20 MG TABS tablet Take 1 tablet (20 mg total) by mouth daily with supper.   rosuvastatin (CRESTOR) 10 MG tablet Take 1 tablet (10 mg total) by mouth daily.   tolterodine (DETROL LA) 4 MG 24 hr capsule Take 4 mg by mouth daily.   vitamin E 180 MG (400 UNITS) capsule Take 400 Units by mouth daily.   [DISCONTINUED]  furosemide (LASIX) 20 MG tablet Take 1 tablet (20 mg total) by mouth daily.     Allergies:   Neosporin [neomycin-bacitracin zn-polymyx]   Social History   Socioeconomic History   Marital status: Married    Spouse name: Renae Fickle Vanlue   Number of children: 2   Years of education: Post graduate   Highest education level: Not on file  Occupational History   Occupation: Research officer, trade union: [employer]  Tobacco Use   Smoking status: Never   Smokeless tobacco: Never  Vaping Use   Vaping Use: Never used  Substance and Sexual Activity   Alcohol use: Never    Alcohol/week: 1.0 standard drink    Types: 1 Standard drinks or equivalent per week   Drug use: Never   Sexual activity: Not Currently    Partners: Male  Other Topics Concern   Not on file  Social History Narrative   History of smoking: Never smoked   No smoking   No Alcohol   Caffeine : yes soda ,diet coke -occas.   No recreational drug use.   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   Last pap smear date 2021   Last mammogram date 10/.21/2017   Abnormal pap smear none.               Social Determinants of Health   Financial Resource Strain: Not on file  Food Insecurity: Not on file  Transportation Needs: Not on file  Physical Activity: Not on file  Stress: Not on file  Social Connections: Not on file     Family History: The patient's family history is not on file.  ROS:   Please see the history of present illness.     (+) palpitations All other systems reviewed and are negative.  EKGs/Labs/Other Studies Reviewed:    The following studies were reviewed today:  Long term monitor 04/22:  Study Highlights   100% A. fib burden, average rate 99 bpm     Patch Wear Time:  2 days and 22 hours (2022-04-11T09:20:16-0400 to  2022-04-14T08:10:40-0400)   Atrial Fibrillation occurred continuously (100% burden), ranging from 59-176 bpm (avg of 99 bpm). Isolated VEs were rare (<1.0%), and no VE Couplets or VE Triplets were present.  No patient triggered events  Echo 01/22:   IMPRESSIONS:  1. Left ventricular ejection fraction, by estimation, is 55 to 60%. The  left ventricle has normal function. The left ventricle has no regional  wall motion abnormalities. There is mild concentric left ventricular  hypertrophy. Left ventricular diastolic  parameters are indeterminate.   2. Right ventricular systolic function is normal. The right ventricular  size is normal. There is normal pulmonary artery systolic pressure.   3. Planimetry has underestimated the atrial size: manual measurement is  would yield a index volume of 45 cc/BSA. Left atrial size was mild to  moderately dilated.   4. Planimetry has underestimated the atrial size: manual measurement is  would yield a index volume of 23 cc/BSA.   5. The mitral valve is grossly normal. Trivial mitral valve  regurgitation.   6. The aortic valve is tricuspid. Aortic valve regurgitation is not  visualized. No aortic stenosis is present.   7. The inferior vena cava is normal in size with greater than 50%  respiratory variability, suggesting right atrial pressure of 3 mmHg.   Echo Tee 02/01/20:   IMPRESSIONS    1. Left ventricular ejection fraction, by estimation, is 50 to 55%. The  left ventricle has low normal function.   2. Right ventricular systolic function is mildly reduced. The right  ventricular size is normal.   3. The mitral valve is normal in structure. Trivial mitral valve  regurgitation.   4. The aortic valve is tricuspid. Aortic valve regurgitation is not  visualized. No aortic stenosis is present.   5. Right atrial size was mildly dilated.   6. Left atrial size was moderately dilated. No left atrial/left atrial  appendage thrombus was detected.  Spontaneous echo contrast was present but  no thrombus seen.   EKG 08/16/20: sinus rhythm rate 60, nonspecific T wave flattening 04/22:A. fib with RVR, rate 110, nonspecific T wave flattening  Recent Labs: 03/06/2020: Hemoglobin 15.9; Platelets 226 08/03/2020: BUN 14; Creatinine, Ser 0.93; Magnesium 2.2; NT-Pro BNP 570; Potassium 4.5; Sodium 141  Recent Lipid Panel No results found for: CHOL, TRIG, HDL, CHOLHDL, VLDL, LDLCALC, LDLDIRECT  Physical Exam:    VS:  BP 118/82 (BP Location: Right Arm, Patient Position: Sitting, Cuff Size: Large)   Pulse 60   Ht  (1.6 m)   Wt 283 lb (128.4 kg)   BMI 50.13 kg/m   BP 110/68  Wt Readings from Last 3 Encounters:  08/16/20 283 lb (128.4 kg)  08/03/20 283 lb (128.4 kg)  07/14/20 283 lb (128.4 kg)     GEN:  in no acute distress HEENT: Normal NECK: No JVD; No carotid bruits CARDIAC: RRR, no murmurs RESPIRATORY:  Clear to auscultation without rales, wheezing or rhonchi  ABDOMEN: Soft, non-tender, non-distended MUSCULOSKELETAL: trace edema; No deformity  SKIN: Warm and dry NEUROLOGIC:  Alert and oriented x 3 PSYCHIATRIC:  Normal affect   ASSESSMENT:    1. Atrial fibrillation, unspecified type (HCC)   2. Chronic diastolic heart failure (HCC)   3. Hyperlipidemia, unspecified hyperlipidemia type   4. Coronary artery disease involving native coronary artery of native heart without angina pectoris   5. Bilateral leg edema     PLAN:    Atrial fibrillation: recent diagnosis, rates 140s at initial clinic visit 01/07/20.  CHA2DS2-VASc score 3 (hypertension, age, female), she was started on Xarelto 01/06/20.  Underwent successful TEE cardioversion on 02/01/2020.  TEE showed EF 50 to 55%, mildly reduced RV function, no LAA thrombus.  Unfortunately converted back to atrial fibrillation a few days following cardioversion.  TTE 02/07/2020 showed EF 55 to 60%, normal RV function, mild to moderate left atrial dilatation.  Was seen in A. fib clinic on  02/17/2020, started on flecainide and underwent successful DCCV on 03/09/2020.  However at follow-up  on 03/28/2020 she was back in A. Fib.  Discussion was had with A. fib clinic about alternative antiarrhythmic (Tikosyn or amiodarone) versus rate control.  Initially decided to pursue rate control but was difficult to rate control and decision was made to load with Tikosyn.  Since loading with Tikosyn she has been maintaining sinus rhythm, though continues to have occasional A. fib that converts spontaneously -Continue Toprol-XL 100 mg twice daily and diltiazem 120 mg daily -Continue Xarelto 20 mg daily  Chronic diastolic heart failure: TTE 02/07/2020 showed EF 55 to 60%, normal RV function, mild to moderate left atrial dilatation, indeterminate diastolic function.  On Lasix 20 mg daily.   -Weight is up 7 pounds from prior visit and appears mildly hypervolemic.  Recommend taking Lasix 40 mg daily x3 days.  Would continue on Lasix 20 mg daily daily and take 40 mg as needed for weight gain greater than 3 pounds in 1 day or 5 pounds in 1 week.  Will check BMP, BNP  CAD: Calcium score on 03/20/2021 was 173 (81st percentile).  No anginal symptoms.  LDL 120, started on rosuvastatin 10 mg daily  Hypertension: On Toprol-XL 100 mg twice daily and diltiazem 120 mg daily.  Appears controlled.  Hyperlipidemia: Reports recent labs showed LDL 120.  Started rosuvastatin 10 mg daily.  Will check lipid panel  Snoring: Sleep study 03/10/2020 showed moderate OSA during REM sleep but overall did not meet criteria for CPAP, recommended considering oral appliance.  RTC in 6 months   Medication Adjustments/Labs and Tests Ordered: Current medicines are reviewed at length with the patient today.  Concerns regarding medicines are outlined above.  Orders Placed This Encounter  Procedures   Lipid panel   Basic metabolic panel   Magnesium   Brain natriuretic peptide   EKG 12-Lead    Meds ordered this encounter  Medications    furosemide (LASIX) 20 MG tablet    Sig: Take 1 tablet (20 mg total) by mouth daily. Ok to take 40 mg if you gain 3 lbs overnight or 5 lbs in 1 week    Dispense:  120 tablet    Refill:  3     Patient Instructions  Medication Instructions:  Your physician recommends that you continue on your current medications as directed. Please refer to the Current Medication list given to you today.  --ok to increase lasix to 40 mg if you gain 3 lbs overnight or 5 lbs in 1 week  *If you need a refill on your cardiac medications before your next appointment, please call your pharmacy*   Lab Work Please return for FASTING labs in 1 week (BMET, Mag, BNP, Lipid)  Our in office lab hours are Monday-Friday 8:00-4:00, closed for lunch 12:45-1:45 pm.  No appointment needed.  Follow-Up: At [facility], you and your health needs are our priority.  As part of our continuing mission to provide you with exceptional heart care, we have created designated Provider Care Teams.  These Care Teams include your primary Cardiologist (physician) and Advanced Practice Providers (APPs -  Physician Assistants and Nurse Practitioners) who all work together to provide you with the care you need, when you need it.  We recommend signing up for the patient portal called "MyChart".  Sign up information is provided on this After Visit Summary.  MyChart is used to connect with patients for Virtual Visits (Telemedicine).  Patients are able to view lab/test results, encounter notes, upcoming appointments, etc.  Non-urgent messages can be sent to  your provider as well.   To learn more about what you can do with MyChart, go to [email or web address].    Your next appointment:   6 month(s)  The format for your next appointment:   In Person  Provider:   Epifanio Lesches, MD       Liz Beach as a scribe for Little Ishikawa, MD.,have documented all relevant documentation on the behalf of Little Ishikawa, MD,as directed by  Little Ishikawa, MD while in the presence of Little Ishikawa, MD.  I, Little Ishikawa, MD, have reviewed all documentation for this visit. The documentation on 08/16/20 for the exam, diagnosis, procedures, and orders are all accurate and complete.  Signed, Little Ishikawa, MD  08/16/2020 5:36 PM    [facility]

## 2020-10-05 ENCOUNTER — Other Ambulatory Visit: Payer: Self-pay | Admitting: Cardiology

## 2020-10-05 DIAGNOSIS — I4891 Unspecified atrial fibrillation: Secondary | ICD-10-CM | POA: Diagnosis not present

## 2020-10-05 DIAGNOSIS — I5032 Chronic diastolic (congestive) heart failure: Secondary | ICD-10-CM | POA: Diagnosis not present

## 2020-10-05 DIAGNOSIS — E785 Hyperlipidemia, unspecified: Secondary | ICD-10-CM | POA: Diagnosis not present

## 2020-10-05 DIAGNOSIS — R600 Localized edema: Secondary | ICD-10-CM | POA: Diagnosis not present

## 2020-10-05 NOTE — Telephone Encounter (Signed)
Pt last saw Dr Bjorn Pippin 08/16/20, last labs 08/03/20 Creat 0.93, age 70, weight 128.4kg, CrCl 115.72, based on CrCl pt is on appropriate dosage of Xarelto 20mg  QD.  Will refill rx.

## 2020-10-06 LAB — LIPID PANEL
Chol/HDL Ratio: 3.1 ratio (ref 0.0–4.4)
Cholesterol, Total: 173 mg/dL (ref 100–199)
HDL: 56 mg/dL (ref >39)
LDL Chol Calc (NIH): 95 mg/dL (ref 0–99)
Triglycerides: 122 mg/dL (ref 0–149)
VLDL Cholesterol Cal: 22 mg/dL (ref 5–40)

## 2020-10-06 LAB — BASIC METABOLIC PANEL
BUN/Creatinine Ratio: 20 (ref 12–28)
BUN: 18 mg/dL (ref 8–27)
CO2: 25 mmol/L (ref 20–29)
Calcium: 9.2 mg/dL (ref 8.7–10.3)
Chloride: 102 mmol/L (ref 96–106)
Creatinine, Ser: 0.88 mg/dL (ref 0.57–1.00)
Glucose: 94 mg/dL (ref 65–99)
Potassium: 4.7 mmol/L (ref 3.5–5.2)
Sodium: 140 mmol/L (ref 134–144)
eGFR: 71 mL/min/{1.73_m2} (ref >59)

## 2020-10-06 LAB — MAGNESIUM: Magnesium: 2.1 mg/dL (ref 1.6–2.3)

## 2020-10-06 LAB — BRAIN NATRIURETIC PEPTIDE: BNP: 128 pg/mL — ABNORMAL HIGH (ref 0.0–100.0)

## 2020-10-13 ENCOUNTER — Other Ambulatory Visit: Payer: Self-pay | Admitting: *Deleted

## 2020-10-13 MED ORDER — ROSUVASTATIN CALCIUM 20 MG PO TABS: 20.0000 mg | ORAL_TABLET | Freq: Every day | ORAL | 3 refills | Status: DC

## 2020-10-17 ENCOUNTER — Ambulatory Visit (INDEPENDENT_AMBULATORY_CARE_PROVIDER_SITE_OTHER): Payer: BC Managed Care – PPO | Admitting: Cardiology

## 2020-10-17 ENCOUNTER — Other Ambulatory Visit: Payer: Self-pay

## 2020-10-17 VITALS — BP 104/68 | HR 75 | Ht 63.0 in | Wt 286.0 lb

## 2020-10-17 DIAGNOSIS — I4819 Other persistent atrial fibrillation: Secondary | ICD-10-CM | POA: Diagnosis not present

## 2020-10-17 DIAGNOSIS — I10 Essential (primary) hypertension: Secondary | ICD-10-CM

## 2020-10-17 DIAGNOSIS — Z79899 Other long term (current) drug therapy: Secondary | ICD-10-CM

## 2020-10-17 DIAGNOSIS — I5032 Chronic diastolic (congestive) heart failure: Secondary | ICD-10-CM | POA: Diagnosis not present

## 2020-10-17 NOTE — Progress Notes (Signed)
Electrophysiology Office Follow up Visit Note:    Date:  10/17/2020   ID:  Megan Waters, DOB May 18, 1950, MRN [id number]  PCP:  Mila Palmer, MD  [facility] Cardiologist:  None  [facility] Electrophysiologist:  Lanier Prude, MD    Interval History:    Megan Waters is a 70 y.o. female who presents for a follow up visit.   I last saw the patient July 14, 2020 for her persistent atrial fibrillation.  She is maintained on dofetilide 250 mcg twice daily.  Since I last saw her the patient tells me that she has continued to have a brief atrial fibrillation episode every 7 to 10 days.  Stress seems to bring on the atrial fibrillation episodes.  They typically resolve after several hours.  She continues to take Xarelto for stroke prophylaxis without bleeding problems.     Past Medical History:  Diagnosis Date   Atrophic vaginitis    Chronic edema    no DVT   GERD (gastroesophageal reflux disease)    Hyperlipidemia    Hypertension    echo - 12/22/2006- Normal EF 60% mild left atrial enlargement ,difficult windows    PUD (peptic ulcer disease)    antral ulcer on EGD 2008- Dr Wendi Snipes colonic polyp    personal hx   Unspecified essential hypertension    nuclear stress-12/22/06 - low risk    Urge and stress incontinence    Vitamin D deficiency     Past Surgical History:  Procedure Laterality Date   CARDIOVERSION N/A 02/01/2020   Procedure: CARDIOVERSION;  Surgeon: Little Ishikawa, MD;  Location[facility] ENDOSCOPY;  Service: Cardiovascular;  Laterality: N/A;   CARDIOVERSION N/A 03/09/2020   Procedure: CARDIOVERSION;  Surgeon: Lars Masson, MD;  Location: [facility] ENDOSCOPY;  Service: Cardiovascular;  Laterality: N/A;   TEE WITHOUT CARDIOVERSION N/A 02/01/2020   Procedure: TRANSESOPHAGEAL ECHOCARDIOGRAM (TEE);  Surgeon: Little Ishikawa, MD;  Location: [facility] ENDOSCOPY;  Service: Cardiovascular;  Laterality: N/A;    Current Medications: Current Meds   Medication Sig   Cholecalciferol (VITAMIN D-3) 125 MCG (5000 UT) TABS Take 5,000 Units by mouth daily at 12 noon.   diltiazem (CARDIZEM CD) 120 MG 24 hr capsule    docusate sodium (COLACE) 100 MG capsule Take 100 mg by mouth daily.   dofetilide (TIKOSYN) 250 MCG capsule Take 1 capsule (250 mcg total) by mouth 2 (two) times daily.   furosemide (LASIX) 20 MG tablet Take 1 tablet (20 mg total) by mouth daily. Ok to take 40 mg if you gain 3 lbs overnight or 5 lbs in 1 week   loratadine (CLARITIN) 10 MG tablet Take 10 mg by mouth daily as needed for allergies.   metoprolol succinate (TOPROL-XL) 100 MG 24 hr tablet Take 1 tablet (100 mg total) by mouth in the morning and at bedtime.   rivaroxaban (XARELTO) 20 MG TABS tablet TAKE 1 TABLET(20 MG) BY MOUTH DAILY WITH SUPPER   rosuvastatin (CRESTOR) 20 MG tablet Take 1 tablet (20 mg total) by mouth daily.   tolterodine (DETROL LA) 4 MG 24 hr capsule Take 4 mg by mouth daily.   vitamin E 180 MG (400 UNITS) capsule Take 400 Units by mouth daily.     Allergies:   Neosporin [neomycin-bacitracin zn-polymyx]   Social History   Socioeconomic History   Marital status: Married    Spouse name: Renae Fickle Reap   Number of children: 2   Years of education: Geophysicist/field seismologist  Highest education level: Not on file  Occupational History   Occupation: Medical Doctor    Employer: [employer]  Tobacco Use   Smoking status: Never   Smokeless tobacco: Never  Vaping Use   Vaping Use: Never used  Substance and Sexual Activity   Alcohol use: Never    Alcohol/week: 1.0 standard drink    Types: 1 Standard drinks or equivalent per week   Drug use: Never   Sexual activity: Not Currently    Partners: Male  Other Topics Concern   Not on file  Social History Narrative   History of smoking: Never smoked   No smoking   No Alcohol   Caffeine : yes soda ,diet coke -occas.   No recreational drug use.   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   Last pap smear date 2021   Last mammogram date 10/.21/2017   Abnormal pap smear none.               Social Determinants of Health   Financial Resource Strain: Not on file  Food Insecurity: Not on file  Transportation Needs: Not on file  Physical Activity: Not on file  Stress: Not on file  Social Connections: Not on file     Family History: The patient's family history is not on file.  ROS:   Please see the history of present illness.    All other systems reviewed and are negative.  EKGs/Labs/Other Studies Reviewed:    The following studies were reviewed today:  EKG:  The ekg ordered today demonstrates sinus rhythm.  QTC is 470 ms  Recent Labs: 03/06/2020: Hemoglobin 15.9; Platelets 226 08/03/2020: NT-Pro BNP 570 10/05/2020: BNP 128.0; BUN 18; Creatinine, Ser 0.88; Magnesium 2.1; Potassium 4.7; Sodium 140  Recent Lipid Panel    Component Value Date/Time   CHOL 173 10/05/2020 0949   TRIG 122 10/05/2020 0949   HDL 56 10/05/2020 0949   CHOLHDL 3.1 10/05/2020 0949   LDLCALC 95 10/05/2020 0949    Physical Exam:    VS:  BP 104/68   Pulse 75   Ht 5\' 3"  (1.6 m)   Wt 286 lb (129.7 kg)   SpO2 96%   BMI 50.66 kg/m     Wt Readings from Last 3 Encounters:  10/17/20 286 lb (129.7 kg)  08/16/20 283 lb (128.4 kg)  08/03/20 283 lb (128.4 kg)     GEN:  Well nourished, well developed in no acute distress.  Obese HEENT: Normal NECK: No JVD; No carotid bruits LYMPHATICS: No lymphadenopathy CARDIAC: RRR, no murmurs, rubs, gallops RESPIRATORY:  Clear to auscultation without rales, wheezing or rhonchi  ABDOMEN: Soft, non-tender, non-distended MUSCULOSKELETAL:  No edema; No deformity  SKIN: Warm and dry NEUROLOGIC:  Alert and oriented x 3 PSYCHIATRIC:  Normal affect   ASSESSMENT:    1. Persistent atrial fibrillation (HCC)   2.  Essential hypertension   3. Chronic diastolic heart failure (HCC)   4. Morbid obesity (HCC)   5. Encounter for long-term (current) use of high-risk medication    PLAN:    In order of problems listed above:  1. Persistent atrial fibrillation (HCC) Significant reduction in the overall burden of her atrial fibrillation while taking dofetilide.  I would continue Tikosyn to 50 mcg  twice daily.  Her QTC is acceptable.  I will have her follow-up with one of the PAs in 3 months.  At that appointment we should recheck her BMP and magnesium.  She is currently not a candidate for ablation given her morbid obesity.  2. Essential hypertension Controlled.  Continue current regimen.  3. Chronic diastolic heart failure (HCC) Rhythm control is indicated.  Appears relatively euvolemic today.  Continue metoprolol, Lasix, diltiazem.  4. Morbid obesity (HCC)   5. Encounter for long-term (current) use of high-risk medication QTC acceptable today.  Repeat ECG and lab work in 3 months.       Follow-up with PA/NP in 3 months.     Total time spent with patient today 35 minutes. This includes reviewing records, evaluating the patient and coordinating care.   Medication Adjustments/Labs and Tests Ordered: Current medicines are reviewed at length with the patient today.  Concerns regarding medicines are outlined above.  Orders Placed This Encounter  Procedures   EKG 12-Lead   No orders of the defined types were placed in this encounter.    Signed, Steffanie Dunn, MD, [facility], [facility] 10/17/2020 5:03 PM    Electrophysiology [facility]

## 2020-10-17 NOTE — Patient Instructions (Signed)
Medication Instructions:  Your physician recommends that you continue on your current medications as directed. Please refer to the Current Medication list given to you today. *If you need a refill on your cardiac medications before your next appointment, please call your pharmacy*  Lab Work: None ordered. If you have labs (blood work) drawn today and your tests are completely normal, you will receive your results only by: MyChart Message (if you have MyChart) OR A paper copy in the mail If you have any lab test that is abnormal or we need to change your treatment, we will call you to review the results.  Testing/Procedures: None ordered.  Follow-Up: At [facility], you and your health needs are our priority.  As part of our continuing mission to provide you with exceptional heart care, we have created designated Provider Care Teams.  These Care Teams include your primary Cardiologist (physician) and Advanced Practice Providers (APPs -  Physician Assistants and Nurse Practitioners) who all work together to provide you with the care you need, when you need it.  Your next appointment:   Your physician wants you to follow-up in: 3 months with one of the following Advanced Practice Providers on your designated Care Team:   Megan Waters, [place] Megan Waters, [place] You will receive a reminder letter in the mail two months in advance. If you don't receive a letter, please call our office to schedule the follow-up appointment.

## 2020-10-19 ENCOUNTER — Ambulatory Visit: Payer: BC Managed Care – PPO | Admitting: Cardiology

## 2020-10-20 ENCOUNTER — Other Ambulatory Visit: Payer: Self-pay

## 2020-10-20 MED ORDER — DOFETILIDE 250 MCG PO CAPS: 250.0000 ug | ORAL_CAPSULE | Freq: Two times a day (BID) | ORAL | 3 refills | Status: DC

## 2020-10-20 NOTE — Telephone Encounter (Signed)
Pt's medication was sent to pt's pharmacy as requested. Confirmation received.  °

## 2020-10-27 ENCOUNTER — Other Ambulatory Visit: Payer: Self-pay | Admitting: Cardiology

## 2020-10-27 NOTE — Telephone Encounter (Signed)
This is Dr. Schumann's pt 

## 2021-01-15 DIAGNOSIS — Z0000 Encounter for general adult medical examination without abnormal findings: Secondary | ICD-10-CM | POA: Diagnosis not present

## 2021-02-04 NOTE — Progress Notes (Signed)
Cardiology Office Note Date:  02/06/2021  Patient ID:  Megan Waters, DOB [birth year], MRN [id number] PCP:  Jonathon Jordan, MD  Cardiologist:  Dr. Gardiner Rhyme Electrophysiologist: Dr. Quentin Ore    Chief Complaint: 3 mo  History of Present Illness: Megan Waters is a 71 y.o. female with history of HTN, HLD, PUD, DVT, Afib, morbid obesity, chronic CHF (diastolic)  She comes in today to be seen for Dr. Quentin Ore, last seen by him Sept 2022, she reported brief episodes of AF about every 7-10days, suspected to be provoked by stress, self resolved after a couple hours.  This a significant improvement in her burden.  Planned to see APP in 3 mo.  Not a candidate for ablation 2/2 morbid obesity.  TODAY She is doing quite well. She is not having NO afib but her burden is much improved and as time goes by her episodes are getting farther apart, and are self limited, perhaps a day long. She  is quite happy with her therapy, and has started to learn to manage her stress better as well, which has helped also.  No CP, no new or unusual SOB/DOE No near syncope or syncope No bleeding or signs of bleeding   Afib Hx Diagnosed Dec 2021 Flecainide Jan 2022 failed, stopped March 2022 Tikosyn started May 2022 is current  Past Medical History:  Diagnosis Date   Atrophic vaginitis    Chronic edema    no DVT   GERD (gastroesophageal reflux disease)    Hyperlipidemia    Hypertension    echo - 12/22/2006- Normal EF 60% mild left atrial enlargement ,difficult windows    PUD (peptic ulcer disease)    antral ulcer on EGD 2008- Dr Chrisandra Netters colonic polyp    personal hx   Unspecified essential hypertension    nuclear stress-12/22/06 - low risk    Urge and stress incontinence    Vitamin D deficiency     Past Surgical History:  Procedure Laterality Date   CARDIOVERSION N/A 02/01/2020   Procedure: CARDIOVERSION;  Surgeon: Donato Heinz, MD;  Location: [facility];  Service:  Cardiovascular;  Laterality: N/A;   CARDIOVERSION N/A 03/09/2020   Procedure: CARDIOVERSION;  Surgeon: Dorothy Spark, MD;  Location: [facility];  Service: Cardiovascular;  Laterality: N/A;   TEE WITHOUT CARDIOVERSION N/A 02/01/2020   Procedure: TRANSESOPHAGEAL ECHOCARDIOGRAM (TEE);  Surgeon: Donato Heinz, MD;  Location: [facility] ENDOSCOPY;  Service: Cardiovascular;  Laterality: N/A;    Current Outpatient Medications  Medication Sig Dispense Refill   Cholecalciferol (VITAMIN D-3) 125 MCG (5000 UT) TABS Take 5,000 Units by mouth daily at 12 noon.     diltiazem (CARDIZEM CD) 120 MG 24 hr capsule TAKE 1 CAPSULE(120 MG) BY MOUTH DAILY 90 capsule 3   docusate sodium (COLACE) 100 MG capsule Take 100 mg by mouth daily.     dofetilide (TIKOSYN) 250 MCG capsule Take 1 capsule (250 mcg total) by mouth 2 (two) times daily. 180 capsule 3   furosemide (LASIX) 20 MG tablet Take 1 tablet (20 mg total) by mouth daily. Ok to take 40 mg if you gain 3 lbs overnight or 5 lbs in 1 week 120 tablet 3   loratadine (CLARITIN) 10 MG tablet Take 10 mg by mouth daily as needed for allergies.     metoprolol succinate (TOPROL-XL) 100 MG 24 hr tablet Take 1 tablet (100 mg total) by mouth in the morning and at bedtime. 180 tablet 3   rivaroxaban (XARELTO) 20 MG  TABS tablet TAKE 1 TABLET(20 MG) BY MOUTH DAILY WITH SUPPER 90 tablet 2   rosuvastatin (CRESTOR) 20 MG tablet Take 1 tablet (20 mg total) by mouth daily. 90 tablet 3   tolterodine (DETROL LA) 4 MG 24 hr capsule Take 4 mg by mouth daily.     vitamin E 180 MG (400 UNITS) capsule Take 400 Units by mouth daily.     No current facility-administered medications for this visit.    Allergies:   Neosporin [neomycin-bacitracin zn-polymyx]   Social History:  The patient  reports that she has never smoked. She has never used smokeless tobacco. She reports that she does not drink alcohol and does not use drugs.   Family History:  The patient's family history includes  Colon cancer in her mother; Heart disease in her mother; Other in her father.  ROS:  Please see the history of present illness.    All other systems are reviewed and otherwise negative.   PHYSICAL EXAM:  VS:  BP 124/78    Pulse 72    Ht 5\' 3"  (1.6 m)    Wt 295 lb (133.8 kg)    SpO2 98%    BMI 52.26 kg/m  BMI: Body mass index is 52.26 kg/m. Well nourished, well developed, in no acute distress HEENT: normocephalic, atraumatic Neck: no JVD, carotid bruits or masses Cardiac:  RRR; no significant murmurs, no rubs, or gallops Lungs:  CTA b/l, no wheezing, rhonchi or rales Abd: soft, nontender MS: no deformity or atrophy Ext: no pitting edema Skin: warm and dry, no rash Neuro:  No gross deficits appreciated Psych: euthymic mood, full affect   EKG:  Done today and reviewed by myself shows  SR 72bpm, QTc 457ms  Long term monitor 04/22:  Study Highlights 100% A. fib burden, average rate 99 bpm  Patch Wear Time:  2 days and 22 hours (2022-04-11T09:20:16-0400 to 2022-04-14T08:10:40-0400)   Atrial Fibrillation occurred continuously (100% burden), ranging from 59-176 bpm (avg of 99 bpm). Isolated VEs were rare (<1.0%), and no VE Couplets or VE Triplets were present.  No patient triggered events   Echo 01/22:  IMPRESSIONS: 1. Left ventricular ejection fraction, by estimation, is 55 to 60%. The  left ventricle has normal function. The left ventricle has no regional  wall motion abnormalities. There is mild concentric left ventricular  hypertrophy. Left ventricular diastolic  parameters are indeterminate.   2. Right ventricular systolic function is normal. The right ventricular  size is normal. There is normal pulmonary artery systolic pressure.   3. Planimetry has underestimated the atrial size: manual measurement is  would yield a index volume of 45 cc/BSA. Left atrial size was mild to  moderately dilated.   4. Planimetry has underestimated the atrial size: manual measurement is  would  yield a index volume of 23 cc/BSA.   5. The mitral valve is grossly normal. Trivial mitral valve  regurgitation.   6. The aortic valve is tricuspid. Aortic valve regurgitation is not  visualized. No aortic stenosis is present.   7. The inferior vena cava is normal in size with greater than 50%  respiratory variability, suggesting right atrial pressure of 3 mmHg.    Echo Tee 02/01/20:  IMPRESSIONS  1. Left ventricular ejection fraction, by estimation, is 50 to 55%. The  left ventricle has low normal function.   2. Right ventricular systolic function is mildly reduced. The right  ventricular size is normal.   3. The mitral valve is normal in structure. Trivial mitral  valve  regurgitation.   4. The aortic valve is tricuspid. Aortic valve regurgitation is not  visualized. No aortic stenosis is present.   5. Right atrial size was mildly dilated.   6. Left atrial size was moderately dilated. No left atrial/left atrial  appendage thrombus was detected. Spontaneous echo contrast was present but  no thrombus seen.     Recent Labs: 03/06/2020: Hemoglobin 15.9; Platelets 226 08/03/2020: NT-Pro BNP 570 10/05/2020: BNP 128.0; BUN 18; Creatinine, Ser 0.88; Magnesium 2.1; Potassium 4.7; Sodium 140  10/05/2020: Chol/HDL Ratio 3.1; Cholesterol, Total 173; HDL 56; LDL Chol Calc (NIH) 95; Triglycerides 122   CrCl cannot be calculated (Patient's most recent lab result is older than the maximum 21 days allowed.).   Wt Readings from Last 3 Encounters:  02/06/21 295 lb (133.8 kg)  10/17/20 286 lb (129.7 kg)  08/16/20 283 lb (128.4 kg)     Other studies reviewed: Additional studies/records reviewed today include: summarized above  ASSESSMENT AND PLAN:  Persistent Afib CHA2DS2Vasc is 4, on xarelto, appropriately dosed Tikosyn stable QTc improved burden by symptoms Labs today Tikosyn teaching re enforced  HTN Looks OK  3. HLD Will get fasting labs done  Disposition: F/u with EP in 64mo, sooner  if needed  Current medicines are reviewed at length with the patient today.  The patient did not have any concerns regarding medicines.  Venetia Night, PA-C 02/06/2021 4:22 PM     [address] [place] [address] [phone] (office)  [phone] (fax)

## 2021-02-06 ENCOUNTER — Ambulatory Visit (INDEPENDENT_AMBULATORY_CARE_PROVIDER_SITE_OTHER): Payer: BC Managed Care – PPO | Admitting: Physician Assistant

## 2021-02-06 ENCOUNTER — Encounter: Payer: Self-pay | Admitting: Physician Assistant

## 2021-02-06 ENCOUNTER — Other Ambulatory Visit: Payer: Self-pay

## 2021-02-06 VITALS — BP 124/78 | HR 72 | Ht 63.0 in | Wt 295.0 lb

## 2021-02-06 DIAGNOSIS — I4819 Other persistent atrial fibrillation: Secondary | ICD-10-CM

## 2021-02-06 DIAGNOSIS — Z79899 Other long term (current) drug therapy: Secondary | ICD-10-CM

## 2021-02-06 DIAGNOSIS — E785 Hyperlipidemia, unspecified: Secondary | ICD-10-CM | POA: Diagnosis not present

## 2021-02-06 DIAGNOSIS — I10 Essential (primary) hypertension: Secondary | ICD-10-CM | POA: Diagnosis not present

## 2021-02-06 DIAGNOSIS — Z5181 Encounter for therapeutic drug level monitoring: Secondary | ICD-10-CM

## 2021-02-06 NOTE — Patient Instructions (Addendum)
Medication Instructions:   Your physician recommends that you continue on your current medications as directed. Please refer to the Current Medication list given to you today.   *If you need a refill on your cardiac medications before your next appointment, please call your pharmacy*   Lab Work:  BMET AND [place] LFT    If you have labs (blood work) drawn today and your tests are completely normal, you will receive your results only by: MyChart Message (if you have MyChart) OR A paper copy in the mail If you have any lab test that is abnormal or we need to change your treatment, we will call you to review the results.   Testing/Procedures: NONE ORDERED  TODAY    Follow-Up: At [facility], you and your health needs are our priority.  As part of our continuing mission to provide you with exceptional heart care, we have created designated Provider Care Teams.  These Care Teams include your primary Cardiologist (physician) and Advanced Practice Providers (APPs -  Physician Assistants and Nurse Practitioners) who all work together to provide you with the care you need, when you need it.  We recommend signing up for the patient portal called "MyChart".  Sign up information is provided on this After Visit Summary.  MyChart is used to connect with patients for Virtual Visits (Telemedicine).  Patients are able to view lab/test results, encounter notes, upcoming appointments, etc.  Non-urgent messages can be sent to your provider as well.   To learn more about what you can do with MyChart, go to [email or web address].    Your next appointment:   6 month(s)  The format for your next appointment:   In Person  Provider:   Lars Mage, MD{   Other Instructions

## 2021-02-07 LAB — BASIC METABOLIC PANEL
BUN/Creatinine Ratio: 16 (ref 12–28)
BUN: 15 mg/dL (ref 8–27)
CO2: 26 mmol/L (ref 20–29)
Calcium: 9.4 mg/dL (ref 8.7–10.3)
Chloride: 100 mmol/L (ref 96–106)
Creatinine, Ser: 0.91 mg/dL (ref 0.57–1.00)
Glucose: 175 mg/dL — ABNORMAL HIGH (ref 70–99)
Potassium: 4.3 mmol/L (ref 3.5–5.2)
Sodium: 138 mmol/L (ref 134–144)
eGFR: 68 mL/min/{1.73_m2} (ref >59)

## 2021-02-07 LAB — MAGNESIUM: Magnesium: 1.9 mg/dL (ref 1.6–2.3)

## 2021-02-08 ENCOUNTER — Other Ambulatory Visit: Payer: Self-pay

## 2021-02-08 ENCOUNTER — Other Ambulatory Visit: Payer: BC Managed Care – PPO

## 2021-02-08 DIAGNOSIS — Z79899 Other long term (current) drug therapy: Secondary | ICD-10-CM

## 2021-02-08 LAB — LIPID PANEL
Chol/HDL Ratio: 3 ratio (ref 0.0–4.4)
Cholesterol, Total: 168 mg/dL (ref 100–199)
HDL: 56 mg/dL (ref >39)
LDL Chol Calc (NIH): 92 mg/dL (ref 0–99)
Triglycerides: 113 mg/dL (ref 0–149)
VLDL Cholesterol Cal: 20 mg/dL (ref 5–40)

## 2021-02-08 LAB — HEPATIC FUNCTION PANEL
ALT: 12 IU/L (ref 0–32)
AST: 19 IU/L (ref 0–40)
Albumin: 4.1 g/dL (ref 3.8–4.8)
Alkaline Phosphatase: 62 IU/L (ref 44–121)
Bilirubin Total: 0.4 mg/dL (ref 0.0–1.2)
Bilirubin, Direct: 0.14 mg/dL (ref 0.00–0.40)
Total Protein: 6.6 g/dL (ref 6.0–8.5)

## 2021-03-22 DIAGNOSIS — L570 Actinic keratosis: Secondary | ICD-10-CM | POA: Diagnosis not present

## 2021-03-24 ENCOUNTER — Other Ambulatory Visit: Payer: Self-pay | Admitting: Cardiology

## 2021-03-26 NOTE — Telephone Encounter (Signed)
This is Dr. Campbell Lerner pt, he prescribed this medication. Thanks

## 2021-04-02 ENCOUNTER — Telehealth: Payer: Self-pay

## 2021-04-02 DIAGNOSIS — R931 Abnormal findings on diagnostic imaging of heart and coronary circulation: Secondary | ICD-10-CM

## 2021-04-02 NOTE — Telephone Encounter (Signed)
? ?  Pre-operative Risk Assessment  ?  ?Patient Name: Megan Waters  ?DOB: [birth year] ?MRN: [id number]  ? ?  ? ?Request for Surgical Clearance   ? ?Procedure:   COLONOSCOPY ? ?Date of Surgery:  Clearance 04/19/21                              ?   ?Surgeon:  DR. Tinnie Gens MEDOFF ?Surgeon's Group or Practice Name:  [facility] ?Phone number:  [phone] ?Fax number:  [phone] ?  ?Type of Clearance Requested:   ?- Pharmacy:  Hold Rivaroxaban (Xarelto) PLEASE GIVE INSTRUCTIONS WHEN TO HOLD  ?  ?Type of Anesthesia:  Not Indicated ?  ?Additional requests/questions:   ? ?Signed, ?Michaelle Copas   ?04/02/2021, 2:54 PM  ? ?

## 2021-04-03 DIAGNOSIS — Z6841 Body Mass Index (BMI) 40.0 and over, adult: Secondary | ICD-10-CM | POA: Insufficient documentation

## 2021-04-03 DIAGNOSIS — I10 Essential (primary) hypertension: Secondary | ICD-10-CM | POA: Insufficient documentation

## 2021-04-03 DIAGNOSIS — R931 Abnormal findings on diagnostic imaging of heart and coronary circulation: Secondary | ICD-10-CM | POA: Insufficient documentation

## 2021-04-03 DIAGNOSIS — E559 Vitamin D deficiency, unspecified: Secondary | ICD-10-CM | POA: Insufficient documentation

## 2021-04-03 NOTE — Telephone Encounter (Signed)
Patient with diagnosis of afib on Xarelto for anticoagulation.   ? ?Procedure: colonoscopy ?Date of procedure: 04/19/21 ? ?CHA2DS2-VASc Score = 5  ?This indicates a 7.2% annual risk of stroke. ?The patient's score is based upon: ?CHF History: 1 ?HTN History: 1 ?Diabetes History: 0 ?Stroke History: 0 ?Vascular Disease History: 1 ?Age Score: 1 ?Gender Score: 1 ?  ?CrCl 52mL/min using adjusted body weight due to morbid obesity ?Platelet count 217K ? ?Per office protocol, patient can hold Xarelto for 1-2 days prior to procedure.   ?

## 2021-04-03 NOTE — Telephone Encounter (Addendum)
? ?  Primary Cardiologist: Little Ishikawa, MD ? ?Chart reviewed as part of pre-operative protocol coverage. Given past medical history and time since last visit, based on ACC/AHA guidelines, Megan Waters would be at acceptable risk for the planned procedure without further cardiovascular testing.  ? ?I will route this recommendation to the requesting party via Epic fax function and remove from pre-op pool. ? ?See recommendations regarding Xarelto from the clinical pharmacist below. ? ?Per office protocol, patient can hold Xarelto for 1-2 days prior to procedure.   ? ?Please call with questions. ? ?Levi Aland, NP-C ? ?  ?04/03/2021, 9:50 AM ?[facility] ?[address], [address] ?Office [phone] Fax [phone] ? ? ? ?Addendum: Requesting office asking when she should resume xarelto. We prefer resumption of oral anticoagulation as soon as safe to do so per surgeon - ideally the evening of her procedure or, at the latest, the following morning. ? ?Marcelino Duster, PA-C ?04/06/2021, 8:00 AM ?[phone] ?[facility] ?[address] ?[place], [place] [address] ? ? ?

## 2021-04-05 ENCOUNTER — Telehealth: Payer: Self-pay

## 2021-04-05 NOTE — Telephone Encounter (Signed)
This is a duplicate encounter, see encounter from 04/02/21 ?

## 2021-04-05 NOTE — Telephone Encounter (Signed)
? ?  Pre-operative Risk Assessment  ?  ?Patient Name: Megan Waters  ?DOB: [birth year] ?MRN: [id number] ? ?  ? ?Request for Surgical Clearance   ? ?Procedure:   Colonoscopy ? ?Date of Surgery:  Clearance 04/19/21                              ? ?Surgeon:  Dr Sharrell Ku ?Surgeon's Group or Practice Name:  [facility] ?Phone number:  [phone] ?Fax number:  [phone] ? ?Type of Clearance Requested:   ?- Pharmacy:  Hold Rivaroxaban (Xarelto)   ?  ?Type of Anesthesia:   Profolol ? ?Additional requests/questions:   N/A ? ?Signed, ?Kandice Robinsons T   ?04/05/2021, 1:58 PM ' ? ?

## 2021-04-05 NOTE — Telephone Encounter (Signed)
Received a fax from requesting providers office stating  " when the patient should resume blood thinner as well per provider request".  ?

## 2021-04-19 DIAGNOSIS — Z800 Family history of malignant neoplasm of digestive organs: Secondary | ICD-10-CM | POA: Diagnosis not present

## 2021-04-19 DIAGNOSIS — D124 Benign neoplasm of descending colon: Secondary | ICD-10-CM | POA: Diagnosis not present

## 2021-04-19 DIAGNOSIS — K635 Polyp of colon: Secondary | ICD-10-CM | POA: Diagnosis not present

## 2021-04-19 DIAGNOSIS — Z1211 Encounter for screening for malignant neoplasm of colon: Secondary | ICD-10-CM | POA: Diagnosis not present

## 2021-04-19 DIAGNOSIS — Z86010 Personal history of colonic polyps: Secondary | ICD-10-CM | POA: Diagnosis not present

## 2021-04-19 DIAGNOSIS — D125 Benign neoplasm of sigmoid colon: Secondary | ICD-10-CM | POA: Diagnosis not present

## 2021-04-24 DIAGNOSIS — L570 Actinic keratosis: Secondary | ICD-10-CM | POA: Diagnosis not present

## 2021-05-14 ENCOUNTER — Other Ambulatory Visit: Payer: Self-pay | Admitting: Cardiology

## 2021-06-04 DIAGNOSIS — M25562 Pain in left knee: Secondary | ICD-10-CM | POA: Diagnosis not present

## 2021-06-04 DIAGNOSIS — M25561 Pain in right knee: Secondary | ICD-10-CM | POA: Diagnosis not present

## 2021-07-23 ENCOUNTER — Ambulatory Visit (INDEPENDENT_AMBULATORY_CARE_PROVIDER_SITE_OTHER): Payer: BC Managed Care – PPO | Admitting: Physician Assistant

## 2021-07-23 ENCOUNTER — Encounter: Payer: Self-pay | Admitting: Physician Assistant

## 2021-07-23 VITALS — BP 140/80 | HR 72 | Ht 63.0 in | Wt 285.6 lb

## 2021-07-23 DIAGNOSIS — I4819 Other persistent atrial fibrillation: Secondary | ICD-10-CM | POA: Diagnosis not present

## 2021-07-23 DIAGNOSIS — R931 Abnormal findings on diagnostic imaging of heart and coronary circulation: Secondary | ICD-10-CM | POA: Diagnosis not present

## 2021-07-23 DIAGNOSIS — I10 Essential (primary) hypertension: Secondary | ICD-10-CM | POA: Diagnosis not present

## 2021-07-23 DIAGNOSIS — E785 Hyperlipidemia, unspecified: Secondary | ICD-10-CM | POA: Diagnosis not present

## 2021-07-23 MED ORDER — DILTIAZEM HCL 30 MG PO TABS
30.0000 mg | ORAL_TABLET | ORAL | 1 refills | Status: AC | PRN
Start: 2021-07-23 — End: ?

## 2021-07-23 MED ORDER — DILTIAZEM HCL ER COATED BEADS 180 MG PO CP24: 180.0000 mg | ORAL_CAPSULE | Freq: Every day | ORAL | 1 refills | Status: DC

## 2021-08-23 ENCOUNTER — Encounter: Payer: Self-pay | Admitting: Cardiology

## 2021-08-23 ENCOUNTER — Other Ambulatory Visit: Payer: Self-pay | Admitting: Cardiology

## 2021-08-23 ENCOUNTER — Ambulatory Visit (INDEPENDENT_AMBULATORY_CARE_PROVIDER_SITE_OTHER): Payer: BC Managed Care – PPO | Admitting: Cardiology

## 2021-08-23 VITALS — BP 122/92 | HR 77 | Ht 63.0 in | Wt 287.0 lb

## 2021-08-23 DIAGNOSIS — E785 Hyperlipidemia, unspecified: Secondary | ICD-10-CM | POA: Diagnosis not present

## 2021-08-23 DIAGNOSIS — I4819 Other persistent atrial fibrillation: Secondary | ICD-10-CM | POA: Diagnosis not present

## 2021-08-23 DIAGNOSIS — Z79899 Other long term (current) drug therapy: Secondary | ICD-10-CM | POA: Diagnosis not present

## 2021-08-23 MED ORDER — DILTIAZEM HCL ER 120 MG PO CP24: 120.0000 mg | ORAL_CAPSULE | Freq: Every day | ORAL | 3 refills | Status: DC

## 2021-08-23 NOTE — Patient Instructions (Addendum)
Medication Instructions:  Your physician recommends that you continue on your current medications as directed. Please refer to the Current Medication list given to you today. *If you need a refill on your cardiac medications before your next appointment, please call your pharmacy*  Lab Work: BMP, MAG, LIPIDS If you have labs (blood work) drawn today and your tests are completely normal, you will receive your results only by: MyChart Message (if you have MyChart) OR A paper copy in the mail If you have any lab test that is abnormal or we need to change your treatment, we will call you to review the results.  Testing/Procedures: None.  Follow-Up: At [facility], you and your health needs are our priority.  As part of our continuing mission to provide you with exceptional heart care, we have created designated Provider Care Teams.  These Care Teams include your primary Cardiologist (physician) and Advanced Practice Providers (APPs -  Physician Assistants and Nurse Practitioners) who all work together to provide you with the care you need, when you need it.  Your physician wants you to follow-up in: 3 months with Pharm[facility] for [facility].   We recommend signing up for the patient portal called "MyChart".  Sign up information is provided on this After Visit Summary.  MyChart is used to connect with patients for Virtual Visits (Telemedicine).  Patients are able to view lab/test results, encounter notes, upcoming appointments, etc.  Non-urgent messages can be sent to your provider as well.   To learn more about what you can do with MyChart, go to [email or web address].    Any Other Special Instructions Will Be Listed Below (If Applicable).
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
## 2021-08-24 LAB — BASIC METABOLIC PANEL
BUN/Creatinine Ratio: 18 (ref 12–28)
BUN: 15 mg/dL (ref 8–27)
CO2: 21 mmol/L (ref 20–29)
Calcium: 9.3 mg/dL (ref 8.7–10.3)
Chloride: 107 mmol/L — ABNORMAL HIGH (ref 96–106)
Creatinine, Ser: 0.82 mg/dL (ref 0.57–1.00)
Glucose: 93 mg/dL (ref 70–99)
Potassium: 4.4 mmol/L (ref 3.5–5.2)
Sodium: 143 mmol/L (ref 134–144)
eGFR: 77 mL/min/{1.73_m2} (ref >59)

## 2021-08-24 LAB — LIPID PANEL
Chol/HDL Ratio: 3 ratio (ref 0.0–4.4)
Cholesterol, Total: 172 mg/dL (ref 100–199)
HDL: 57 mg/dL (ref >39)
LDL Chol Calc (NIH): 88 mg/dL (ref 0–99)
Triglycerides: 155 mg/dL — ABNORMAL HIGH (ref 0–149)
VLDL Cholesterol Cal: 27 mg/dL (ref 5–40)

## 2021-08-24 LAB — MAGNESIUM: Magnesium: 2.1 mg/dL (ref 1.6–2.3)

## 2021-10-04 ENCOUNTER — Other Ambulatory Visit: Payer: Self-pay

## 2021-10-04 MED ORDER — DOFETILIDE 250 MCG PO CAPS: 250.0000 ug | ORAL_CAPSULE | Freq: Two times a day (BID) | ORAL | 3 refills | Status: DC

## 2021-10-06 ENCOUNTER — Other Ambulatory Visit: Payer: Self-pay | Admitting: Cardiology

## 2021-10-08 NOTE — Telephone Encounter (Signed)
Prescription refill request for Xarelto received.  Indication:Afib Last office visit:7/23 Weight:130.2 kg Age:71 Scr:0.8 CrCl:134.49 ml/min  Prescription refilled

## 2021-11-23 ENCOUNTER — Ambulatory Visit: Payer: BC Managed Care – PPO | Attending: Cardiovascular Disease | Admitting: Pharmacist

## 2021-11-23 DIAGNOSIS — E785 Hyperlipidemia, unspecified: Secondary | ICD-10-CM | POA: Diagnosis not present

## 2021-11-23 DIAGNOSIS — I10 Essential (primary) hypertension: Secondary | ICD-10-CM | POA: Diagnosis not present

## 2021-11-23 DIAGNOSIS — R931 Abnormal findings on diagnostic imaging of heart and coronary circulation: Secondary | ICD-10-CM | POA: Diagnosis not present

## 2021-11-23 MED ORDER — EZETIMIBE 10 MG PO TABS: 10.0000 mg | ORAL_TABLET | Freq: Every day | ORAL | 3 refills | Status: DC

## 2021-11-23 NOTE — Patient Instructions (Signed)
Please start taking ezetimibe 10mg  daily. Continue rosuvastatin 20mg  daily. Start walking on a regular basis Try to drink more water and incorporate more vegetables and fruit into your diet Please call us with any questions [phone]

## 2021-11-23 NOTE — Assessment & Plan Note (Signed)
Assessment:  BP elevated when she is in our clinic  BP much better at health check at work  Has a BP cuff at home, but doesn't use it  Plan:  I have asked her to start checking blood pressure a few times a week at home  Call us if BP is >130/80

## 2021-11-23 NOTE — Progress Notes (Signed)
Patient ID: Megan Waters                 DOB: 12/16/1950                    MRN: [id number]      HPI: Megan Waters is a 71 y.o. female patient referred to lipid clinic by Dr. Quentin Ore. PMH is significant for afib and HTN. CT in 2022 showed calcified plaque in ostial RCA and proximal LAD. Calcium score 173. This was 81st percentile for age and sex matched control. Last LDL-C was 88.   Patient presents to lipid clinic today. She reports tolerating rosuvastatin well. Has not done much exercise since her afib diagnosis. Does not cook and eats a lot of prepackaged /prepared foods. Had her lipid panel done with work, LDL-C was 80, TG were better. Her BP was also much better there than in our office.   Current Medications: rosuvastatin 20mg  daily Intolerances: none Risk Factors: HTN, CAC 173 LDL goal: <70  Diet:  Breakfast: 2 cheese sticks- colbe jack Lunch: protein bar and maybe a fiber bar, yogurt Dinner: pad thi noodles, small amount of lasagna, cottage cheese Drink: caffeine free diet coke  Exercise: not much, plans to start walking soon  Family History: The patient's family history includes Colon cancer in her mother; Heart disease in her mother; Other in her father  Social History: no tobacco, very rare ETOH  Labs: 08/23/21 TC 172, TG 155, HDL 57, LDL-C 88 (rosuvastatin 20mg  daily)  Past Medical History:  Diagnosis Date   Atrophic vaginitis    Chronic edema    no DVT   GERD (gastroesophageal reflux disease)    Hyperlipidemia    Hypertension    echo - 12/22/2006- Normal EF 60% mild left atrial enlargement ,difficult windows    PUD (peptic ulcer disease)    antral ulcer on EGD 2008- Dr Chrisandra Netters colonic polyp    personal hx   Unspecified essential hypertension    nuclear stress-12/22/06 - low risk    Urge and stress incontinence    Vitamin D deficiency     Current Outpatient Medications on File Prior to Visit  Medication Sig Dispense Refill   rosuvastatin  (CRESTOR) 20 MG tablet TAKE 1 TABLET(20 MG) BY MOUTH DAILY 90 tablet 3   Cholecalciferol (VITAMIN D-3) 125 MCG (5000 UT) TABS Take 5,000 Units by mouth daily at 12 noon.     diltiazem (CARDIZEM) 30 MG tablet Take 1 tablet (30 mg total) by mouth as needed (for palpitations or a heart rate over 120). (Patient not taking: Reported on 08/23/2021) 30 tablet 1   diltiazem (DILACOR XR) 120 MG 24 hr capsule Take 1 capsule (120 mg total) by mouth daily. 90 capsule 3   docusate sodium (COLACE) 100 MG capsule Take 100 mg by mouth daily.     dofetilide (TIKOSYN) 250 MCG capsule Take 1 capsule (250 mcg total) by mouth 2 (two) times daily. 180 capsule 3   furosemide (LASIX) 20 MG tablet Take 1 tablet (20 mg total) by mouth daily. Ok to take 40 mg if you gain 3 lbs overnight or 5 lbs in 1 week 120 tablet 3   loratadine (CLARITIN) 10 MG tablet Take 10 mg by mouth daily as needed for allergies.     metoprolol succinate (TOPROL-XL) 100 MG 24 hr tablet TAKE 1 TABLET(100 MG) BY MOUTH IN THE MORNING AND AT BEDTIME 180 tablet 3   tolterodine (DETROL LA)  4 MG 24 hr capsule Take 4 mg by mouth daily.     vitamin E 180 MG (400 UNITS) capsule Take 400 Units by mouth daily.     XARELTO 20 MG TABS tablet TAKE 1 TABLET(20 MG) BY MOUTH DAILY WITH SUPPER 90 tablet 2   No current facility-administered medications on file prior to visit.    Allergies  Allergen Reactions   Neosporin [Neomycin-Bacitracin Zn-Polymyx]     ( triple) WBC inflamation: allergy    Assessment/Plan:  1. Hyperlipidemia -   Hyperlipidemia Assessment: LDL-C above goal of <70 Patient's exercise is limited, although she use to walk a lot 2 years ago Tolerating rosuvastatin well We discussed the 5 modifiable risk factors for CVD (lipids, BP, Tobacco use, diet and exercise) BP unknown if well controlled, patient does not use tobacco.  Plan:  Add ezetimbie 10mg  daily Continue rosuvastatin 20mg  daily Recheck labs in 3 months- lipids, apob,  lft Encouraged her to resume walking on a regular basis Add more vegetables and fruit to diet and drink more water   Stage 1 hypertension Assessment: BP elevated when she is in our clinic BP much better at health check at work Has a BP cuff at home, but doesn't use it  Plan: I have asked her to start checking blood pressure a few times a week at home Call if BP is >130/80   Thank you,   , Pharm.D, BCPS, CPP [facility] [address] [place], [address] [place]  Phone: [phone]; Fax: [phone]

## 2021-11-23 NOTE — Assessment & Plan Note (Signed)
Assessment:  LDL-C above goal of <70  Patient's exercise is limited, although she use to walk a lot 2 years ago  Tolerating rosuvastatin well  We discussed the 5 modifiable risk factors for CVD (lipids, BP, Tobacco use, diet and exercise) BP unknown if well controlled, patient does not use tobacco.  Plan:   Add ezetimbie 10mg  daily  Continue rosuvastatin 20mg  daily  Recheck labs in 3 months- lipids, apob, lft  Encouraged her to resume walking on a regular basis  Add more vegetables and fruit to diet and drink more water

## 2022-02-06 ENCOUNTER — Other Ambulatory Visit: Payer: Self-pay | Admitting: Family Medicine

## 2022-02-06 DIAGNOSIS — E2839 Other primary ovarian failure: Secondary | ICD-10-CM

## 2022-02-07 ENCOUNTER — Other Ambulatory Visit: Payer: Self-pay | Admitting: Family Medicine

## 2022-02-07 DIAGNOSIS — Z1231 Encounter for screening mammogram for malignant neoplasm of breast: Secondary | ICD-10-CM

## 2022-02-08 ENCOUNTER — Other Ambulatory Visit: Payer: Self-pay | Admitting: Cardiology

## 2022-02-19 ENCOUNTER — Ambulatory Visit: Payer: 59 | Attending: Cardiology

## 2022-02-19 DIAGNOSIS — R931 Abnormal findings on diagnostic imaging of heart and coronary circulation: Secondary | ICD-10-CM

## 2022-02-19 DIAGNOSIS — E785 Hyperlipidemia, unspecified: Secondary | ICD-10-CM

## 2022-02-20 LAB — HEPATIC FUNCTION PANEL
ALT: 17 IU/L (ref 0–32)
AST: 15 IU/L (ref 0–40)
Albumin: 4.1 g/dL (ref 3.8–4.8)
Alkaline Phosphatase: 65 IU/L (ref 44–121)
Bilirubin Total: 0.5 mg/dL (ref 0.0–1.2)
Bilirubin, Direct: 0.15 mg/dL (ref 0.00–0.40)
Total Protein: 6.6 g/dL (ref 6.0–8.5)

## 2022-02-20 LAB — LIPID PANEL
Chol/HDL Ratio: 2.4 ratio (ref 0.0–4.4)
Cholesterol, Total: 151 mg/dL (ref 100–199)
HDL: 62 mg/dL (ref >39)
LDL Chol Calc (NIH): 68 mg/dL (ref 0–99)
Triglycerides: 116 mg/dL (ref 0–149)
VLDL Cholesterol Cal: 21 mg/dL (ref 5–40)

## 2022-02-20 LAB — APOLIPOPROTEIN B: Apolipoprotein B: 66 mg/dL (ref <90)

## 2022-02-21 ENCOUNTER — Telehealth: Payer: Self-pay | Admitting: Pharmacist

## 2022-02-21 MED ORDER — ROSUVASTATIN CALCIUM 20 MG PO TABS: ORAL_TABLET | ORAL | 3 refills | Status: DC

## 2022-02-21 MED ORDER — EZETIMIBE 10 MG PO TABS: 10.0000 mg | ORAL_TABLET | Freq: Every day | ORAL | 3 refills | Status: DC

## 2022-02-21 NOTE — Telephone Encounter (Signed)
Spoke with patient. Reviewed her labs with her. LDL-C and ApoB at goal. Doing fine of rosuvastatin and ezetimibe. 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 Don't think its medication related.

## 2022-03-21 ENCOUNTER — Other Ambulatory Visit: Payer: Self-pay

## 2022-03-21 MED ORDER — DOFETILIDE 250 MCG PO CAPS: 250.0000 ug | ORAL_CAPSULE | Freq: Two times a day (BID) | ORAL | 2 refills | Status: DC

## 2022-03-21 NOTE — Telephone Encounter (Signed)
Patient rx was sent to pts pharmacy as requested. Confirmation received.

## 2022-03-25 ENCOUNTER — Other Ambulatory Visit: Payer: Self-pay

## 2022-03-25 MED ORDER — DOFETILIDE 250 MCG PO CAPS: 250.0000 ug | ORAL_CAPSULE | Freq: Two times a day (BID) | ORAL | 2 refills | Status: DC

## 2022-04-05 ENCOUNTER — Ambulatory Visit
Admission: RE | Admit: 2022-04-05 | Discharge: 2022-04-05 | Disposition: A | Discharge status: Home / Self Care | Payer: 59 | Source: Ambulatory Visit | Attending: Family Medicine | Admitting: Family Medicine

## 2022-04-05 DIAGNOSIS — Z1231 Encounter for screening mammogram for malignant neoplasm of breast: Secondary | ICD-10-CM

## 2022-04-08 ENCOUNTER — Other Ambulatory Visit: Payer: Self-pay | Admitting: *Deleted

## 2022-04-08 DIAGNOSIS — I4819 Other persistent atrial fibrillation: Secondary | ICD-10-CM

## 2022-04-08 MED ORDER — METOPROLOL SUCCINATE ER 100 MG PO TB24: 100.0000 mg | ORAL_TABLET | Freq: Every day | ORAL | 1 refills | Status: DC

## 2022-04-08 MED ORDER — RIVAROXABAN 20 MG PO TABS: ORAL_TABLET | ORAL | 1 refills | Status: DC

## 2022-04-08 MED ORDER — DILTIAZEM HCL ER 120 MG PO CP24: 120.0000 mg | ORAL_CAPSULE | Freq: Every day | ORAL | 3 refills | Status: DC

## 2022-04-08 NOTE — Telephone Encounter (Signed)
Prescription refill request for Xarelto received.  Indication: Afib  Last office visit: 08/23/21 Quentin Ore)  Weight: 130.2kg Age: 72 Scr: 0.82 (08/23/21)  CrCl: 129.9m/min  Appropriate dose. Refill sent.

## 2022-06-25 ENCOUNTER — Other Ambulatory Visit: Payer: Self-pay

## 2022-06-25 MED ORDER — METOPROLOL SUCCINATE ER 100 MG PO TB24: ORAL_TABLET | ORAL | 0 refills | Status: DC

## 2022-06-25 NOTE — Telephone Encounter (Signed)
Pt's medication was resent to pt's pharmacy corectly as requested. Confirmation received.

## 2022-08-14 ENCOUNTER — Other Ambulatory Visit (HOSPITAL_COMMUNITY): Payer: Self-pay

## 2022-08-14 ENCOUNTER — Other Ambulatory Visit: Payer: Self-pay

## 2022-08-14 MED ORDER — DOFETILIDE 250 MCG PO CAPS: 250.0000 ug | ORAL_CAPSULE | Freq: Two times a day (BID) | ORAL | 1 refills | Status: DC

## 2022-08-16 ENCOUNTER — Other Ambulatory Visit (HOSPITAL_COMMUNITY): Payer: Self-pay

## 2022-08-16 ENCOUNTER — Telehealth: Payer: Self-pay

## 2022-08-16 MED ORDER — DOFETILIDE 250 MCG PO CAPS: 250.0000 ug | ORAL_CAPSULE | Freq: Two times a day (BID) | ORAL | 0 refills | Status: DC

## 2022-08-16 NOTE — Telephone Encounter (Signed)
Called pt and inform her that her prior Berkley Harvey was approved. Pt verbalized understanding.

## 2022-08-16 NOTE — Telephone Encounter (Signed)
Pharmacy Patient Advocate Encounter  Received notification from [facility] that Prior Authorization for DOFETILIDE has been APPROVED from 08/16/22 to 08/16/23.Marland Kitchen

## 2022-08-16 NOTE — Telephone Encounter (Signed)
PA request has been Submitted. New Encounter created for follow up. For additional info see Pharmacy Prior Auth telephone encounter from 08/16/22.

## 2022-08-16 NOTE — Telephone Encounter (Signed)
Pt is calling stating that her medication dofetilide needs a prior auth done for her to be able to get her medication. Please address

## 2022-08-16 NOTE — Telephone Encounter (Signed)
Pharmacy Patient Advocate Encounter   Received notification from Pt Calls Messages that prior authorization for DOFETILIDE is required/requested.   Insurance verification completed.   The patient is insured through [facility] .   Per test claim: PA submitted to [facility] via CoverMyMeds Key/confirmation #/EOC [id number] Status is pending

## 2022-09-09 ENCOUNTER — Other Ambulatory Visit: Payer: Self-pay | Admitting: Cardiology

## 2022-09-09 DIAGNOSIS — I4819 Other persistent atrial fibrillation: Secondary | ICD-10-CM

## 2022-09-09 NOTE — Telephone Encounter (Signed)
Prescription refill request for Xarelto received.  Indication:afib Last office visit:10/23 Weight:130.2  kg Age:72 Scr:0.82  2023 CrCl:129.34  ml/min  Prescription refilled

## 2022-09-21 ENCOUNTER — Other Ambulatory Visit: Payer: Self-pay | Admitting: Cardiology

## 2022-10-06 ENCOUNTER — Other Ambulatory Visit: Payer: Self-pay | Admitting: Cardiology

## 2022-11-20 ENCOUNTER — Other Ambulatory Visit: Payer: Self-pay | Admitting: Cardiology

## 2022-11-29 ENCOUNTER — Other Ambulatory Visit: Payer: Self-pay | Admitting: Cardiology

## 2022-12-10 ENCOUNTER — Telehealth: Payer: Self-pay | Admitting: Cardiology

## 2022-12-10 NOTE — Telephone Encounter (Signed)
Dr. Bjorn Pippin prescribed this medication and it was sent to pt's pharmacy asking pt to make overdue appointment with Dr. Bjorn Pippin. Rx sent to pharmacy for a 30 day supply. Confirmation received.

## 2022-12-10 NOTE — Telephone Encounter (Signed)
 *  STAT* If patient is at the pharmacy, call can be transferred to refill team.   1. Which medications need to be refilled? (please list name of each medication and dose if known) metoprolol succinate (TOPROL-XL) 100 MG 24 hr tablet    2. Would you like to learn more about the convenience, safety, & potential cost savings by using the [facility]?    3. Are you open to using the [facility] (Type [facility].    4. Which pharmacy/location (including street and city if local pharmacy) is medication to be sent to?  [id number] [place] [place] - [address]     5. Do they need a 30 day or 90 day supply? 90 days   Pt made an appt with Otilio Saber on 11/26

## 2022-12-20 NOTE — Progress Notes (Unsigned)
  Electrophysiology Office Note:   Date:  12/20/2022  ID:  Megan Waters, DOB Jun 23, 1950, MRN [id number]  Primary Cardiologist: Little Ishikawa, MD Electrophysiologist: Lanier Prude, MD  {Click to update primary MD,subspecialty MD or APP then REFRESH:1}    History of Present Illness:   Megan Waters is a 72 y.o. female with h/o persistent AF on tikosyn, HLD, and obesity seen today for routine electrophysiology followup.   Since last being seen in our clinic the patient reports doing ***.  she denies chest pain, palpitations, dyspnea, PND, orthopnea, nausea, vomiting, dizziness, syncope, edema, weight gain, or early satiety.   Review of systems complete and found to be negative unless listed in HPI.   EP Information / Studies Reviewed:    EKG is ordered today. Personal review as below.       Echo 01/2020 LVEF 55-60%, trivial MR  Physical Exam:   VS:  There were no vitals taken for this visit.   Wt Readings from Last 3 Encounters:  08/23/21 287 lb (130.2 kg)  07/23/21 285 lb 9.6 oz (129.5 kg)  02/06/21 295 lb (133.8 kg)     GEN: Well nourished, well developed in no acute distress NECK: No JVD; No carotid bruits CARDIAC: {EPRHYTHM:28826}, no murmurs, rubs, gallops RESPIRATORY:  Clear to auscultation without rales, wheezing or rhonchi  ABDOMEN: Soft, non-tender, non-distended EXTREMITIES:  No edema; No deformity   ASSESSMENT AND PLAN:    Persistent AF EKG today shows *** Continue tikosyn 250 mcg BID Continue diltiazem 120 mg daily Continue metoprolol 100 mg BID Continue Xarelto 20 mg for CHA2DS2/VASc of at least 5.   Obesity There is no height or weight on file to calculate BMI.  Encouraged lifestyle modification   HLD Continue statin    {Click here to Review PMH, Prob List, Meds, Allergies, SHx, FHx  :1}   Follow up with {OZDGU:44034} {EPFOLLOW VQ:25956}  Signed, Graciella Freer, PA-C

## 2022-12-24 ENCOUNTER — Ambulatory Visit: Payer: 59 | Attending: Student | Admitting: Student

## 2022-12-24 ENCOUNTER — Other Ambulatory Visit (HOSPITAL_COMMUNITY): Payer: Self-pay

## 2022-12-24 ENCOUNTER — Telehealth: Payer: Self-pay | Admitting: Cardiology

## 2022-12-24 ENCOUNTER — Encounter: Payer: Self-pay | Admitting: Student

## 2022-12-24 VITALS — BP 130/84 | HR 60 | Ht 63.0 in | Wt 292.2 lb

## 2022-12-24 DIAGNOSIS — I4819 Other persistent atrial fibrillation: Secondary | ICD-10-CM

## 2022-12-24 DIAGNOSIS — E785 Hyperlipidemia, unspecified: Secondary | ICD-10-CM

## 2022-12-24 MED ORDER — METOPROLOL SUCCINATE ER 100 MG PO TB24
100.0000 mg | ORAL_TABLET | Freq: Two times a day (BID) | ORAL | 3 refills | Status: DC
  Filled 2022-12-24: qty 60, 30d supply, fill #0

## 2022-12-24 MED ORDER — METOPROLOL SUCCINATE ER 100 MG PO TB24: 100.0000 mg | ORAL_TABLET | Freq: Two times a day (BID) | ORAL | 1 refills | Status: DC

## 2022-12-24 NOTE — Telephone Encounter (Signed)
*  STAT* If patient is at the pharmacy, call can be transferred to refill team.   1. Which medications need to be refilled? (please list name of each medication and dose if known)   metoprolol succinate (TOPROL-XL) 100 MG 24 hr tablet   2. Would you like to learn more about the convenience, safety, & potential cost savings by using the [facility]?   3. Are you open to using the [facility] (Type [facility]. ).  4. Which pharmacy/location (including street and city if local pharmacy) is medication to be sent to?  [id number] [place] - [address]   5. Do they need a 30 day or 90 day supply?  90 day  Patient stated she is running out of this medication.  Patient noted her prescription was sent to the wrong pharmacy and [facility] [address]

## 2022-12-24 NOTE — Patient Instructions (Signed)
Medication Instructions:  Your physician recommends that you continue on your current medications as directed. Please refer to the Current Medication list given to you today.  *If you need a refill on your cardiac medications before your next appointment, please call your pharmacy*  Lab Work: MAG-TODAY If you have labs (blood work) drawn today and your tests are completely normal, you will receive your results only by: MyChart Message (if you have MyChart) OR A paper copy in the mail If you have any lab test that is abnormal or we need to change your treatment, we will call you to review the results.  Follow-Up: At [facility], you and your health needs are our priority.  As part of our continuing mission to provide you with exceptional heart care, we have created designated Provider Care Teams.  These Care Teams include your primary Cardiologist (physician) and Advanced Practice Providers (APPs -  Physician Assistants and Nurse Practitioners) who all work together to provide you with the care you need, when you need it.  We recommend signing up for the patient portal called "MyChart".  Sign up information is provided on this After Visit Summary.  MyChart is used to connect with patients for Virtual Visits (Telemedicine).  Patients are able to view lab/test results, encounter notes, upcoming appointments, etc.  Non-urgent messages can be sent to your provider as well.   To learn more about what you can do with MyChart, go to [email or web address].    Your next appointment:   6 month(s)  Provider:   Casimiro Needle "Otilio Saber, PA-C

## 2022-12-24 NOTE — Telephone Encounter (Signed)
Refills has been sent to the pharmacy.

## 2023-01-15 ENCOUNTER — Other Ambulatory Visit: Payer: Self-pay | Admitting: Cardiology

## 2023-01-28 LAB — MAGNESIUM: Magnesium: 2.2 mg/dL (ref 1.6–2.3)

## 2023-01-30 ENCOUNTER — Other Ambulatory Visit: Payer: Self-pay | Admitting: Cardiology

## 2023-02-07 ENCOUNTER — Other Ambulatory Visit: Payer: Self-pay | Admitting: Cardiology

## 2023-04-15 ENCOUNTER — Telehealth: Payer: Self-pay | Admitting: Cardiology

## 2023-04-15 DIAGNOSIS — I4819 Other persistent atrial fibrillation: Secondary | ICD-10-CM

## 2023-04-15 MED ORDER — RIVAROXABAN 20 MG PO TABS: 20.0000 mg | ORAL_TABLET | Freq: Every day | ORAL | 0 refills | Status: DC

## 2023-04-15 NOTE — Telephone Encounter (Signed)
*  STAT* If patient is at the pharmacy, call can be transferred to refill team.   1. Which medications need to be refilled? (please list name of each medication and dose if known)   XARELTO 20 MG TABS tablet    2. Which pharmacy/location (including street and city if local pharmacy) is medication to be sent to? [id number] [place] [place] - [address] AT [facility] [address] Phone: [phone]  Fax: [phone]     3. Do they need a 30 day or 90 day supply? Tried to make appt with pt but she did not have her calendar with her. She said she will c/b to schedule appt. Pt is aware she will be getting limited supply of medication til she makes appt

## 2023-04-15 NOTE — Telephone Encounter (Signed)
 Prescription refill request for Eliquis received. Indication: atrial fib Last office visit:11/24 - Megan Waters Scr: last available in 2023 Age: 73 Weight: 132.5 kg  Will refill to 5/25 appt, left message in appt note to update labs

## 2023-04-17 ENCOUNTER — Other Ambulatory Visit: Payer: Self-pay | Admitting: Cardiology

## 2023-04-18 MED ORDER — ROSUVASTATIN CALCIUM 20 MG PO TABS: 20.0000 mg | ORAL_TABLET | Freq: Every day | ORAL | 0 refills | Status: DC

## 2023-04-25 ENCOUNTER — Other Ambulatory Visit: Payer: Self-pay | Admitting: Cardiology

## 2023-06-18 ENCOUNTER — Telehealth: Payer: Self-pay | Admitting: Student

## 2023-06-18 MED ORDER — DOFETILIDE 250 MCG PO CAPS: 250.0000 ug | ORAL_CAPSULE | Freq: Two times a day (BID) | ORAL | 1 refills | Status: DC

## 2023-06-18 NOTE — Telephone Encounter (Signed)
*  STAT* If patient is at the pharmacy, call can be transferred to refill team.   1. Which medications need to be refilled? (please list name of each medication and dose if known) Dofetilide    2. Would you like to learn more about the convenience, safety, & potential cost savings by using the [facility]?     3. Are you open to using the [facility] (Type [facility]. .   4. Which pharmacy/location (including street and city if local pharmacy) is medication to be sent [facility] [address] [facility]   5. Do they need a 30 day or 90 day supply? Need enough  until  her appointment on 06-27-23

## 2023-06-18 NOTE — Telephone Encounter (Signed)
 Pt's medication was sent to pt's pharmacy as requested. Confirmation received.

## 2023-06-21 ENCOUNTER — Other Ambulatory Visit: Payer: Self-pay | Admitting: Cardiology

## 2023-06-25 NOTE — Progress Notes (Unsigned)
  Electrophysiology Office Note:   Date:  06/27/2023  ID:  Keionna Kinnaird Quevedo, DOB 1950-06-28, MRN [id number]  Primary Cardiologist: Wendie [place], MD Electrophysiologist: Boyce Byes, MD      History of Present Illness:   Tailyn Hantz Rede is a 73 y.o. female with h/o persistent AF on tikosyn , HLD, and obesity seen today for routine electrophysiology followup.   Since last being seen in our clinic the patient reports doing OK. Has palpitations 2-3 times a month, longest it has lasted has been ~12 hrs. Never overnight or a whole day. Otherwise,  she denies chest pain, dyspnea, PND, orthopnea, nausea, vomiting, dizziness, syncope, edema, weight gain, or early satiety.  Has tried losing wait but states artificial sweeteners and intermittent fasting both increased her AF burden.   Review of systems complete and found to be negative unless listed in HPI.   EP Information / Studies Reviewed:    EKG is ordered today. Personal review as below.  EKG Interpretation Date/Time:  Friday Jun 27 2023 08:42:44 EDT Ventricular Rate:  58 PR Interval:  184 QRS Duration:  86 QT Interval:  468 QTC Calculation: 459 R Axis:   19  Text Interpretation: Sinus bradycardia with sinus arrhythmia Low voltage QRS When compared with ECG of 24-Dec-2022 08:47, No significant change was found Confirmed by Pilar Bridge [phone]) on 06/27/2023 8:45:50 AM    Arrhythmia/Device History No specialty comments available.   Physical Exam:   VS:  BP 124/78   Ht 5\' 3"  (1.6 m)   Wt 291 lb 8 oz (132.2 kg)   SpO2 99%   BMI 51.64 kg/m    Wt Readings from Last 3 Encounters:  06/27/23 291 lb 8 oz (132.2 kg)  12/24/22 292 lb 3.2 oz (132.5 kg)  08/23/21 287 lb (130.2 kg)     GEN: No acute distress NECK: No JVD; No carotid bruits CARDIAC: Regular rate and rhythm, no murmurs, rubs, gallops RESPIRATORY:  Clear to auscultation without rales, wheezing or rhonchi  ABDOMEN: Soft, non-tender,  non-distended EXTREMITIES:  No edema; No deformity   ASSESSMENT AND PLAN:    Persistent AF EKG today shows NSR with stable intervals Continue tikosyn  250 mcg BID Continue diltiazem  120 mg daily Continue metoprolol  100 mg dialy Continue Xarelto  20 mg daily for CHA2DS2VASc of at least 5  Obesity Body mass index is 51.64 kg/m.  Encouraged lifestyle modification   HLD Continue statin    Follow up with EP APP in 6 months  Signed, Tylene Galla, PA-C

## 2023-06-27 ENCOUNTER — Encounter: Payer: Self-pay | Admitting: Student

## 2023-06-27 ENCOUNTER — Ambulatory Visit: Attending: Student | Admitting: Student

## 2023-06-27 VITALS — BP 124/78 | Ht 63.0 in | Wt 291.5 lb

## 2023-06-27 DIAGNOSIS — E785 Hyperlipidemia, unspecified: Secondary | ICD-10-CM | POA: Diagnosis not present

## 2023-06-27 DIAGNOSIS — I4819 Other persistent atrial fibrillation: Secondary | ICD-10-CM

## 2023-06-27 NOTE — Patient Instructions (Signed)
 Medication Instructions:  No medication changes today. *If you need a refill on your cardiac medications before your next appointment, please call your pharmacy*  Lab Work: BMET and Magnesium  level today.  If you have labs (blood work) drawn today and your tests are completely normal, you will receive your results only by: MyChart Message (if you have MyChart) OR A paper copy in the mail If you have any lab test that is abnormal or we need to change your treatment, we will call you to review the results.  Testing/Procedures: No testing ordered today  Follow-Up: At [facility], you and your health needs are our priority.  As part of our continuing mission to provide you with exceptional heart care, our providers are all part of one team.  This team includes your primary Cardiologist (physician) and Advanced Practice Providers or APPs (Physician Assistants and Nurse Practitioners) who all work together to provide you with the care you need, when you need it.  Your next appointment:   6 month(s)  Provider:   You may see Boyce Byes, MD or one of the following Advanced Practice Providers on your designated Care Team:   Mertha Abrahams, [place] Bambi Lever "Jonelle Neri" [place], PA-C Suzann Riddle, NP Creighton Doffing, NP    We recommend signing up for the patient portal called "MyChart".  Sign up information is provided on this After Visit Summary.  MyChart is used to connect with patients for Virtual Visits (Telemedicine).  Patients are able to view lab/test results, encounter notes, upcoming appointments, etc.  Non-urgent messages can be sent to your provider as well.   To learn more about what you can do with MyChart, go to [email or web address].

## 2023-06-28 LAB — BASIC METABOLIC PANEL WITH GFR
BUN/Creatinine Ratio: 19 (ref 12–28)
BUN: 16 mg/dL (ref 8–27)
CO2: 22 mmol/L (ref 20–29)
Calcium: 9.3 mg/dL (ref 8.7–10.3)
Chloride: 104 mmol/L (ref 96–106)
Creatinine, Ser: 0.86 mg/dL (ref 0.57–1.00)
Glucose: 104 mg/dL — ABNORMAL HIGH (ref 70–99)
Potassium: 4.6 mmol/L (ref 3.5–5.2)
Sodium: 143 mmol/L (ref 134–144)
eGFR: 72 mL/min/{1.73_m2} (ref >59)

## 2023-06-28 LAB — MAGNESIUM: Magnesium: 2.1 mg/dL (ref 1.6–2.3)

## 2023-06-29 ENCOUNTER — Ambulatory Visit: Payer: Self-pay | Admitting: Student

## 2023-07-11 ENCOUNTER — Other Ambulatory Visit: Payer: Self-pay | Admitting: Cardiology

## 2023-07-11 DIAGNOSIS — I4819 Other persistent atrial fibrillation: Secondary | ICD-10-CM

## 2023-07-11 NOTE — Telephone Encounter (Signed)
 Prescription refill request for Xarelto  received.  Indication:afib Last office visit:5/25 Weight:132.2  kg Age:73 Scr:0.86  5/25 CrCl:123.4  ml/min  Prescription refilled

## 2023-07-17 ENCOUNTER — Other Ambulatory Visit (HOSPITAL_COMMUNITY): Payer: Self-pay

## 2023-07-18 ENCOUNTER — Telehealth: Payer: Self-pay | Admitting: Pharmacy Technician

## 2023-07-18 NOTE — Telephone Encounter (Signed)
 Pharmacy Patient Advocate Encounter   Received notification from Fax that prior authorization for dofetilide  250 MCG is required/requested.   Insurance verification completed.   The patient is insured through [facility] .   Per test claim: PA required; PA submitted to above mentioned insurance via Fax Key/confirmation #/EOC faxed Status is pending

## 2023-07-21 ENCOUNTER — Other Ambulatory Visit (HOSPITAL_COMMUNITY): Payer: Self-pay

## 2023-07-21 NOTE — Telephone Encounter (Signed)
 Pharmacy Patient Advocate Encounter  Received notification from [facility] that Prior Authorization for dofetilide  250 has been APPROVED from 07/21/23 to 07/20/24. Unable to obtain price due to refill too soon rejection, last fill date 07/16/23 next available fill date07/11/25   PA #/Case ID/Reference #: [id number]

## 2023-08-20 ENCOUNTER — Other Ambulatory Visit: Payer: Self-pay | Admitting: Cardiology

## 2023-08-29 ENCOUNTER — Other Ambulatory Visit: Payer: Self-pay | Admitting: Cardiology

## 2023-09-09 ENCOUNTER — Other Ambulatory Visit: Payer: Self-pay | Admitting: Cardiology

## 2023-09-23 ENCOUNTER — Telehealth: Payer: Self-pay | Admitting: Cardiology

## 2023-09-23 MED ORDER — METOPROLOL SUCCINATE ER 100 MG PO TB24: 100.0000 mg | ORAL_TABLET | Freq: Two times a day (BID) | ORAL | 2 refills | Status: DC

## 2023-09-23 NOTE — Telephone Encounter (Signed)
 Pt c/o medication issue:  1. Name of Medication:   metoprolol  succinate (TOPROL -XL) 100 MG 24 hr tablet    2. How are you currently taking this medication (dosage and times per day)?  TAKE 1 TABLET BY MOUTH EVERY DAY      3. Are you having a reaction (difficulty breathing--STAT)? No  4. What is your medication issue? Pt is requesting a callback regarding her stating she picked up the medication and was only given 90 day supply when she should have 180 based on the instructions she was given for how to take them twice a day. Pt would like to discuss further with nurse to gain understanding of the mix up. Please advise

## 2023-09-23 NOTE — Telephone Encounter (Signed)
 Pt reports that she is supposed to be on Toprol  XL 100 mg BID, not 100mg  daily.  Looked through office visits and she is correct. Correct dose sent to pharmacy. I apologized to the patient for this. She was understanding.    OV 12/24/22    Persistent AF EKG today shows NSR with stable intervals Continue tikosyn  250 mcg BID Continue diltiazem  120 mg daily Continue metoprolol  100 mg BID Continue Xarelto  20 mg for CHA2DS2/VASc of at least 5.     OV 06/27/23 Medication Instructions:  No medication changes today. *If you need a refill on your cardiac medications before your next appointment, please call your pharmacy*     ASSESSMENT AND PLAN:     Persistent AF EKG today shows NSR with stable intervals Continue tikosyn  250 mcg BID Continue diltiazem  120 mg daily Continue metoprolol  100 mg dialy Continue Xarelto  20 mg daily for CHA2DS2VASc of at least 5  Was not supposed to be changed according to notes;

## 2023-12-17 ENCOUNTER — Telehealth: Payer: Self-pay | Admitting: Cardiology

## 2023-12-17 MED ORDER — DOFETILIDE 250 MCG PO CAPS: 250.0000 ug | ORAL_CAPSULE | Freq: Two times a day (BID) | ORAL | 1 refills | Status: DC

## 2023-12-17 NOTE — Telephone Encounter (Signed)
 Refill sent.

## 2023-12-17 NOTE — Telephone Encounter (Signed)
*  STAT* If patient is at the pharmacy, call can be transferred to refill team.   1. Which medications need to be refilled? (please list name of each medication and dose if known)   dofetilide  (TIKOSYN ) 250 MCG capsule   2. Would you like to learn more about the convenience, safety, & potential cost savings by using the [facility]?   3. Are you open to using the [facility] (Type [facility]. ).  4. Which pharmacy/location (including street and city if local pharmacy) is medication to be sent to?  [id number] [place], [place] - [address]   5. Do they need a 30 day or 90 day supply?   90 day  Patient stated she is almost out of this medication.  Patient has appointment scheduled on 11/26 with FABIENE Arthur, PA.
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
## 2023-12-24 ENCOUNTER — Ambulatory Visit: Attending: Internal Medicine | Admitting: Physician Assistant

## 2023-12-24 VITALS — BP 141/83 | HR 66 | Ht 63.0 in | Wt 274.0 lb

## 2023-12-24 DIAGNOSIS — I4819 Other persistent atrial fibrillation: Secondary | ICD-10-CM | POA: Diagnosis not present

## 2023-12-24 DIAGNOSIS — I10 Essential (primary) hypertension: Secondary | ICD-10-CM | POA: Diagnosis not present

## 2023-12-24 DIAGNOSIS — D6869 Other thrombophilia: Secondary | ICD-10-CM

## 2023-12-24 DIAGNOSIS — Z5181 Encounter for therapeutic drug level monitoring: Secondary | ICD-10-CM

## 2023-12-24 DIAGNOSIS — Z79899 Other long term (current) drug therapy: Secondary | ICD-10-CM

## 2023-12-24 MED ORDER — DOFETILIDE 250 MCG PO CAPS: 250.0000 ug | ORAL_CAPSULE | Freq: Two times a day (BID) | ORAL | 3 refills | Status: AC

## 2023-12-24 NOTE — Addendum Note (Signed)
 Addended by: RUBBIE KERRI HERO on: 12/24/2023 03:39 PM   Modules accepted: Orders

## 2023-12-24 NOTE — Patient Instructions (Signed)
 Medication Instructions:   Your physician recommends that you continue on your current medications as directed. Please refer to the Current Medication list given to you today.   *If you need a refill on your cardiac medications before your next appointment, please call your pharmacy*   Lab Work: NONE ORDERED  TODAY    If you have labs (blood work) drawn today and your tests are completely normal, you will receive your results only by: MyChart Message (if you have MyChart) OR A paper copy in the mail If you have any lab test that is abnormal or we need to change your treatment, we will call you to review the results.   Testing/Procedures: NONE ORDERED  TODAY    Follow-Up: At [facility], you and your health needs are our priority.  As part of our continuing mission to provide you with exceptional heart care, our providers are all part of one team.  This team includes your primary Cardiologist (physician) and Advanced Practice Providers or APPs (Physician Assistants and Nurse Practitioners) who all work together to provide you with the care you need, when you need it.  Your next appointment:    6 month(s)   Provider:    Charlies Arthur, PA-C    We recommend signing up for the patient portal called "MyChart".  Sign up information is provided on this After Visit Summary.  MyChart is used to connect with patients for Virtual Visits (Telemedicine).  Patients are able to view lab/test results, encounter notes, upcoming appointments, etc.  Non-urgent messages can be sent to your provider as well.   To learn more about what you can do with MyChart, go to [email or web address].   Other Instructions

## 2023-12-25 LAB — CBC
Hematocrit: 43.6 % (ref 34.0–46.6)
Hemoglobin: 14.5 g/dL (ref 11.1–15.9)
MCH: 29.5 pg (ref 26.6–33.0)
MCHC: 33.3 g/dL (ref 31.5–35.7)
MCV: 89 fL (ref 79–97)
Platelets: 210 x10E3/uL (ref 150–450)
RBC: 4.91 x10E6/uL (ref 3.77–5.28)
RDW: 12.6 % (ref 11.7–15.4)
WBC: 8.6 x10E3/uL (ref 3.4–10.8)

## 2023-12-25 LAB — BASIC METABOLIC PANEL WITH GFR
BUN/Creatinine Ratio: 30 — AB (ref 12–28)
BUN: 24 mg/dL (ref 8–27)
CO2: 23 mmol/L (ref 20–29)
Calcium: 9.8 mg/dL (ref 8.7–10.3)
Chloride: 103 mmol/L (ref 96–106)
Creatinine, Ser: 0.81 mg/dL (ref 0.57–1.00)
Glucose: 87 mg/dL (ref 70–99)
Potassium: 4.5 mmol/L (ref 3.5–5.2)
Sodium: 142 mmol/L (ref 134–144)
eGFR: 77 mL/min/1.73 (ref >59)

## 2023-12-25 LAB — MAGNESIUM: Magnesium: 2.2 mg/dL (ref 1.6–2.3)

## 2023-12-29 ENCOUNTER — Ambulatory Visit: Payer: Self-pay | Admitting: Physician Assistant

## 2023-12-30 ENCOUNTER — Other Ambulatory Visit: Payer: Self-pay

## 2023-12-30 DIAGNOSIS — I4819 Other persistent atrial fibrillation: Secondary | ICD-10-CM

## 2023-12-30 MED ORDER — RIVAROXABAN 20 MG PO TABS: 20.0000 mg | ORAL_TABLET | Freq: Every day | ORAL | 1 refills | Status: AC

## 2023-12-30 NOTE — Telephone Encounter (Signed)
 Prescription refill request for Xarelto  received.  Indication:afib Last office visit:11/25 Weight:124.3  kg Age:73 Scr:0.81  11/25 CrCl:121.38  ml/min  Prescription refilled

## 2024-01-15 ENCOUNTER — Other Ambulatory Visit: Payer: Self-pay | Admitting: Cardiology

## 2024-01-26 ENCOUNTER — Other Ambulatory Visit: Payer: Self-pay

## 2024-01-27 ENCOUNTER — Telehealth: Payer: Self-pay | Admitting: Cardiology

## 2024-01-27 MED ORDER — ROSUVASTATIN CALCIUM 20 MG PO TABS: 20.0000 mg | ORAL_TABLET | Freq: Every day | ORAL | 0 refills | Status: AC

## 2024-01-27 NOTE — Telephone Encounter (Signed)
" °*  STAT* If patient is at the pharmacy, call can be transferred to refill team.   1. Which medications need to be refilled? (please list name of each medication and dose if known)   metoprolol  succinate (TOPROL -XL) 100 MG 24 hr tablet     2. Would you like to learn more about the convenience, safety, & potential cost savings by using the [facility]? No    3. Are you open to using the [facility] (Type [facility]. No    4. Which pharmacy/location (including street and city if local pharmacy) is medication to be sent to?[id number] [place], [place] - [address]    5. Do they need a 30 day or 90 day supply? 90 day   "

## 2024-01-28 MED ORDER — METOPROLOL SUCCINATE ER 100 MG PO TB24: 100.0000 mg | ORAL_TABLET | Freq: Two times a day (BID) | ORAL | 2 refills | Status: AC

## 2024-01-28 NOTE — Telephone Encounter (Signed)
 Requested Prescriptions   Signed Prescriptions Disp Refills   metoprolol  succinate (TOPROL -XL) 100 MG 24 hr tablet 180 tablet 2    Sig: Take 1 tablet (100 mg total) by mouth in the morning and at bedtime.    Authorizing Provider: KATE LONNI CROME    Ordering User: Chayne Baumgart  C
